# Patient Record
Sex: Female | Born: 1990
Health system: Southern US, Community
[De-identification: ages and names within clinical notes are randomized; demographics above are authoritative.]

## PROBLEM LIST (undated history)

## (undated) DIAGNOSIS — Z8619 Personal history of other infectious and parasitic diseases: Secondary | ICD-10-CM

## (undated) DIAGNOSIS — R3915 Urgency of urination: Secondary | ICD-10-CM

## (undated) DIAGNOSIS — N3010 Interstitial cystitis (chronic) without hematuria: Secondary | ICD-10-CM

## (undated) DIAGNOSIS — L309 Dermatitis, unspecified: Secondary | ICD-10-CM

## (undated) DIAGNOSIS — R350 Frequency of micturition: Secondary | ICD-10-CM

## (undated) DIAGNOSIS — H469 Unspecified optic neuritis: Secondary | ICD-10-CM

## (undated) DIAGNOSIS — Z973 Presence of spectacles and contact lenses: Secondary | ICD-10-CM

## (undated) DIAGNOSIS — R3989 Other symptoms and signs involving the genitourinary system: Secondary | ICD-10-CM

## (undated) DIAGNOSIS — R351 Nocturia: Secondary | ICD-10-CM

## (undated) HISTORY — DX: Interstitial cystitis (chronic) without hematuria: N30.10

## (undated) HISTORY — DX: Unspecified optic neuritis: H46.9

---

## 2011-02-13 ENCOUNTER — Emergency Department (HOSPITAL_COMMUNITY)
Admission: EM | Admit: 2011-02-13 | Discharge: 2011-02-13 | Disposition: A | Discharge status: Home / Self Care | Payer: Self-pay | Attending: Emergency Medicine | Admitting: Emergency Medicine

## 2011-02-13 DIAGNOSIS — N390 Urinary tract infection, site not specified: Secondary | ICD-10-CM | POA: Insufficient documentation

## 2011-02-13 DIAGNOSIS — R109 Unspecified abdominal pain: Secondary | ICD-10-CM | POA: Insufficient documentation

## 2011-02-13 LAB — URINE MICROSCOPIC-ADD ON

## 2011-02-13 LAB — URINALYSIS, ROUTINE W REFLEX MICROSCOPIC
Bilirubin Urine: NEGATIVE
Nitrite: NEGATIVE
Specific Gravity, Urine: 1.017 (ref 1.005–1.030)
pH: 7 (ref 5.0–8.0)

## 2011-02-13 LAB — POCT PREGNANCY, URINE: Preg Test, Ur: NEGATIVE

## 2011-12-02 ENCOUNTER — Encounter (HOSPITAL_COMMUNITY): Payer: Self-pay

## 2011-12-02 ENCOUNTER — Inpatient Hospital Stay (HOSPITAL_COMMUNITY)
Admission: AD | Admit: 2011-12-02 | Discharge: 2011-12-03 | Disposition: A | Discharge status: Home / Self Care | Payer: Self-pay | Source: Ambulatory Visit | Attending: Obstetrics & Gynecology | Admitting: Obstetrics & Gynecology

## 2011-12-02 DIAGNOSIS — R109 Unspecified abdominal pain: Secondary | ICD-10-CM | POA: Insufficient documentation

## 2011-12-02 DIAGNOSIS — R350 Frequency of micturition: Secondary | ICD-10-CM | POA: Insufficient documentation

## 2011-12-02 NOTE — MAU Note (Signed)
Patient is in with c/o constant severe sharp abdominal pain for over a month. She states that 2 weeks ago, she was treated for cervitis at the health dept. She denies any vaginal bleeding

## 2011-12-02 NOTE — MAU Note (Signed)
About 2 wks ago went to health dept and cervix was very irritated. Treated for cervicitis. Had intercourse last night and didn't cause pain but pain started afterward to the point I couldn't sleep. Frequent urination.

## 2011-12-03 ENCOUNTER — Encounter (HOSPITAL_COMMUNITY): Payer: Self-pay | Admitting: Family

## 2011-12-03 DIAGNOSIS — R350 Frequency of micturition: Secondary | ICD-10-CM

## 2011-12-03 LAB — WET PREP, GENITAL
Clue Cells Wet Prep HPF POC: NONE SEEN
Trich, Wet Prep: NONE SEEN

## 2011-12-03 LAB — URINALYSIS, ROUTINE W REFLEX MICROSCOPIC
Bilirubin Urine: NEGATIVE
Ketones, ur: NEGATIVE mg/dL
Nitrite: NEGATIVE
Protein, ur: NEGATIVE mg/dL
Specific Gravity, Urine: 1.01 (ref 1.005–1.030)
Urobilinogen, UA: 2 mg/dL — ABNORMAL HIGH (ref 0.0–1.0)

## 2011-12-03 LAB — URINE MICROSCOPIC-ADD ON

## 2011-12-03 MED ORDER — SEPTRA DS 800-160 MG PO TABS: 1.0000 | ORAL_TABLET | Freq: Two times a day (BID) | ORAL | Status: AC

## 2011-12-03 NOTE — MAU Provider Note (Signed)
History     CSN: [id number]  Arrival date and time: 12/02/11 2322   First Provider Initiated Contact with Patient 12/03/11 0023      Chief Complaint  Patient presents with  . Abdominal Pain   HPI  Pt is here with report of lower abdominal pain x 1 month.  No abnormal vaginal discharge or dysuria, +frequency x 1.5 wks.  No reports of fever, nausea, vomiting, or diarrhea.    Past Medical History  Diagnosis Date  . Chlamydia   . Gonorrhea   . UTI (lower urinary tract infection)     History reviewed. No pertinent past surgical history.  History reviewed. No pertinent family history.  History  Substance Use Topics  . Smoking status: Never Smoker   . Smokeless tobacco: Not on file  . Alcohol Use: No    Allergies: No Known Allergies  No prescriptions prior to admission    Review of Systems  Gastrointestinal: Positive for abdominal pain.  Genitourinary: Positive for frequency. Negative for dysuria, urgency and hematuria.  All other systems reviewed and are negative.   Physical Exam   Blood pressure 122/72, pulse 91, temperature 98.8 F (37.1 C), temperature source Oral, resp. rate 20, height 5\' 3"  (1.6 m), weight 50.077 kg (110 lb 6.4 oz), last menstrual period 11/11/2011.  Physical Exam  Constitutional: She is oriented to person, place, and time. She appears well-developed and well-nourished. No distress.  HENT:  Head: Normocephalic.  Eyes: Pupils are equal, round, and reactive to light.  Neck: Normal range of motion. Neck supple.  Cardiovascular: Normal rate, regular rhythm and normal heart sounds.   Respiratory: Effort normal and breath sounds normal.  GI: Soft. She exhibits no mass. There is no tenderness. There is no rebound and no guarding.  Genitourinary: Uterus normal. Uterus is not enlarged. Cervix exhibits no motion tenderness, no discharge and no friability. Right adnexum displays no mass, no tenderness and no fullness. Left adnexum displays no mass, no  tenderness and no fullness. Vaginal discharge (white, creamy) found.       Negative cervical motion tenderness  Neurological: She is alert and oriented to person, place, and time. She has normal reflexes.  Skin: Skin is warm and dry.    MAU Course  Procedures Results for orders placed during the hospital encounter of 12/02/11 (from the past 24 hour(s))  URINALYSIS, ROUTINE W REFLEX MICROSCOPIC     Status: Abnormal   Collection Time   12/02/11 11:35 PM      Component Value Range   Color, Urine YELLOW  YELLOW   APPearance CLEAR  CLEAR   Specific Gravity, Urine 1.010  1.005 - 1.030   pH 7.5  5.0 - 8.0   Glucose, UA NEGATIVE  NEGATIVE mg/dL   Hgb urine dipstick TRACE (*) NEGATIVE   Bilirubin Urine NEGATIVE  NEGATIVE   Ketones, ur NEGATIVE  NEGATIVE mg/dL   Protein, ur NEGATIVE  NEGATIVE mg/dL   Urobilinogen, UA 2.0 (*) 0.0 - 1.0 mg/dL   Nitrite NEGATIVE  NEGATIVE   Leukocytes, UA NEGATIVE  NEGATIVE  URINE MICROSCOPIC-ADD ON     Status: Normal   Collection Time   12/02/11 11:35 PM      Component Value Range   Squamous Epithelial / LPF RARE  RARE   WBC, UA 0-2  <3 WBC/hpf   RBC / HPF 0-2  <3 RBC/hpf  POCT PREGNANCY, URINE     Status: Normal   Collection Time   12/02/11 11:49 PM  Component Value Range   Preg Test, Ur NEGATIVE  NEGATIVE  WET PREP, GENITAL     Status: Abnormal   Collection Time   12/03/11 12:35 AM      Component Value Range   Yeast Wet Prep HPF POC NONE SEEN  NONE SEEN   Trich, Wet Prep NONE SEEN  NONE SEEN   Clue Cells Wet Prep HPF POC NONE SEEN  NONE SEEN   WBC, Wet Prep HPF POC FEW (*) NONE SEEN     Assessment and Plan  Urinary Frequency  Plan: RX Bactrim Urine Culture Follow-up prn   [facility] 12/03/2011, 12:24 AM

## 2011-12-03 NOTE — MAU Provider Note (Signed)
Attestation of Attending Supervision of Advanced Practitioner (CNM/NP): Evaluation and management procedures were performed by the Advanced Practitioner under my supervision and collaboration.  I have reviewed the Advanced Practitioner's note and chart, and I agree with the management and plan.  Jaynie Collins, M.D. 12/03/2011 8:29 AM

## 2011-12-05 LAB — GC/CHLAMYDIA PROBE AMP, GENITAL
Chlamydia, DNA Probe: NEGATIVE
GC Probe Amp, Genital: NEGATIVE

## 2013-07-25 HISTORY — PX: LIPOSUCTION: SHX10

## 2013-08-13 ENCOUNTER — Encounter (HOSPITAL_COMMUNITY): Payer: Self-pay | Admitting: Emergency Medicine

## 2013-08-13 ENCOUNTER — Emergency Department (HOSPITAL_COMMUNITY): Payer: BC Managed Care – PPO

## 2013-08-13 ENCOUNTER — Emergency Department (HOSPITAL_COMMUNITY)
Admission: EM | Admit: 2013-08-13 | Discharge: 2013-08-13 | Disposition: A | Discharge status: Home / Self Care | Payer: BC Managed Care – PPO | Attending: Emergency Medicine | Admitting: Emergency Medicine

## 2013-08-13 ENCOUNTER — Emergency Department (HOSPITAL_COMMUNITY)
Admission: EM | Admit: 2013-08-13 | Discharge: 2013-08-13 | Discharge status: Against Medical Advice | Payer: BC Managed Care – PPO | Attending: Emergency Medicine | Admitting: Emergency Medicine

## 2013-08-13 DIAGNOSIS — R0602 Shortness of breath: Secondary | ICD-10-CM | POA: Insufficient documentation

## 2013-08-13 DIAGNOSIS — Z87440 Personal history of urinary (tract) infections: Secondary | ICD-10-CM | POA: Insufficient documentation

## 2013-08-13 DIAGNOSIS — Z3202 Encounter for pregnancy test, result negative: Secondary | ICD-10-CM | POA: Insufficient documentation

## 2013-08-13 DIAGNOSIS — Z8619 Personal history of other infectious and parasitic diseases: Secondary | ICD-10-CM | POA: Insufficient documentation

## 2013-08-13 DIAGNOSIS — R519 Headache, unspecified: Secondary | ICD-10-CM

## 2013-08-13 DIAGNOSIS — H53149 Visual discomfort, unspecified: Secondary | ICD-10-CM | POA: Insufficient documentation

## 2013-08-13 DIAGNOSIS — R5383 Other fatigue: Secondary | ICD-10-CM

## 2013-08-13 DIAGNOSIS — R110 Nausea: Secondary | ICD-10-CM | POA: Insufficient documentation

## 2013-08-13 DIAGNOSIS — R51 Headache: Secondary | ICD-10-CM | POA: Insufficient documentation

## 2013-08-13 DIAGNOSIS — R5381 Other malaise: Secondary | ICD-10-CM | POA: Insufficient documentation

## 2013-08-13 DIAGNOSIS — G43909 Migraine, unspecified, not intractable, without status migrainosus: Secondary | ICD-10-CM | POA: Insufficient documentation

## 2013-08-13 LAB — I-STAT TROPONIN, ED: Troponin i, poc: 0 ng/mL (ref 0.00–0.08)

## 2013-08-13 LAB — BASIC METABOLIC PANEL
BUN: 7 mg/dL (ref 6–23)
CALCIUM: 9.5 mg/dL (ref 8.4–10.5)
CO2: 21 meq/L (ref 19–32)
CREATININE: 0.49 mg/dL — AB (ref 0.50–1.10)
Chloride: 103 mEq/L (ref 96–112)
GFR calc Af Amer: >90 mL/min (ref >90)
Glucose, Bld: 98 mg/dL (ref 70–99)
Potassium: 3.5 mEq/L — ABNORMAL LOW (ref 3.7–5.3)
Sodium: 142 mEq/L (ref 137–147)

## 2013-08-13 LAB — CBC WITH DIFFERENTIAL/PLATELET
BASOS ABS: 0.1 10*3/uL (ref 0.0–0.1)
BASOS PCT: 1 % (ref 0–1)
EOS ABS: 0 10*3/uL (ref 0.0–0.7)
EOS PCT: 0 % (ref 0–5)
HEMATOCRIT: 37.4 % (ref 36.0–46.0)
Hemoglobin: 13.8 g/dL (ref 12.0–15.0)
LYMPHS PCT: 18 % (ref 12–46)
Lymphs Abs: 0.9 10*3/uL (ref 0.7–4.0)
MCH: 32.4 pg (ref 26.0–34.0)
MCHC: 36.9 g/dL — AB (ref 30.0–36.0)
MCV: 87.8 fL (ref 78.0–100.0)
MONO ABS: 0.2 10*3/uL (ref 0.1–1.0)
Monocytes Relative: 3 % (ref 3–12)
Neutro Abs: 3.7 10*3/uL (ref 1.7–7.7)
Neutrophils Relative %: 77 % (ref 43–77)
PLATELETS: 243 10*3/uL (ref 150–400)
RBC: 4.26 MIL/uL (ref 3.87–5.11)
RDW: 12.7 % (ref 11.5–15.5)
WBC: 4.8 10*3/uL (ref 4.0–10.5)

## 2013-08-13 LAB — POC URINE PREG, ED: Preg Test, Ur: NEGATIVE

## 2013-08-13 LAB — D-DIMER, QUANTITATIVE (NOT AT ARMC)

## 2013-08-13 IMAGING — CR DG CHEST 2V
2 series · 2 of 2 positions shown · non-contrast
Comparison: None.

CLINICAL DATA: Shortness of breath.

EXAM:
CHEST  2 VIEW

[w chest pa]
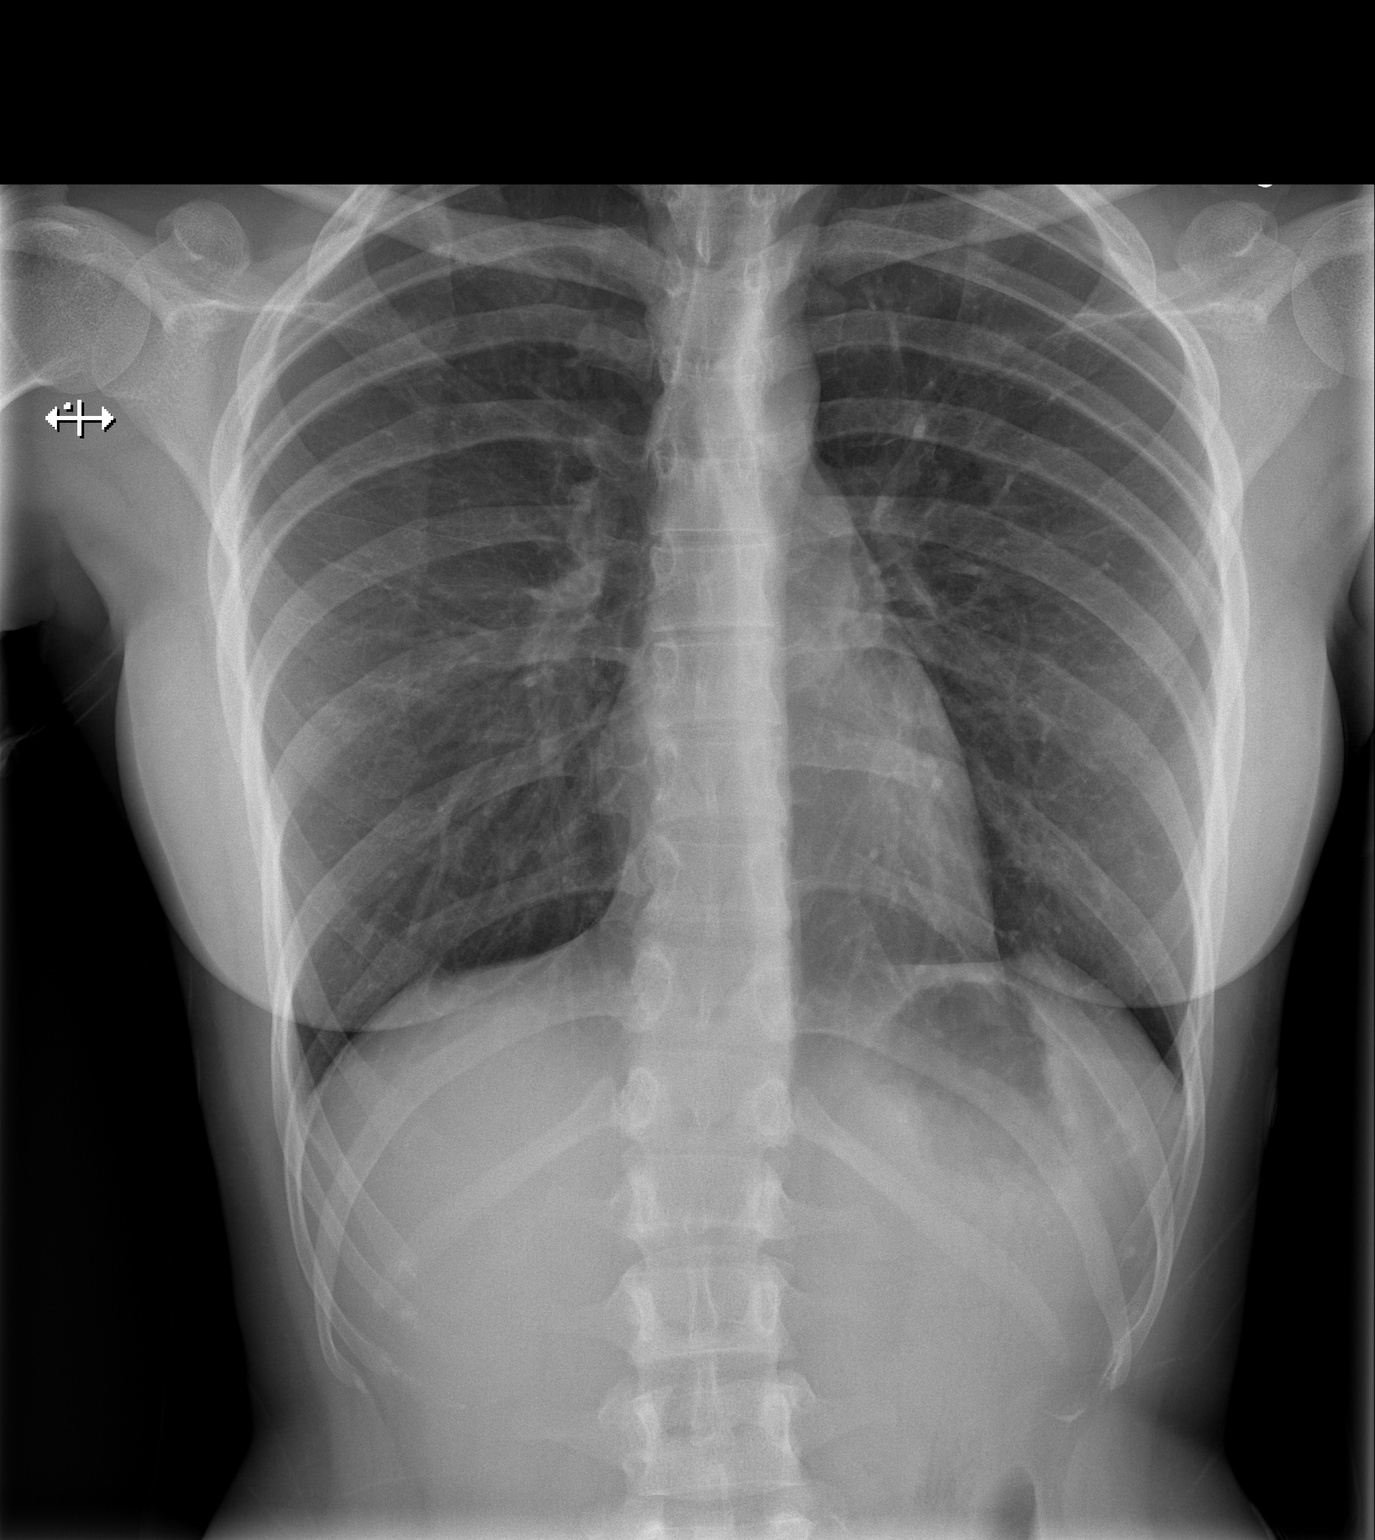

[w chest lat]
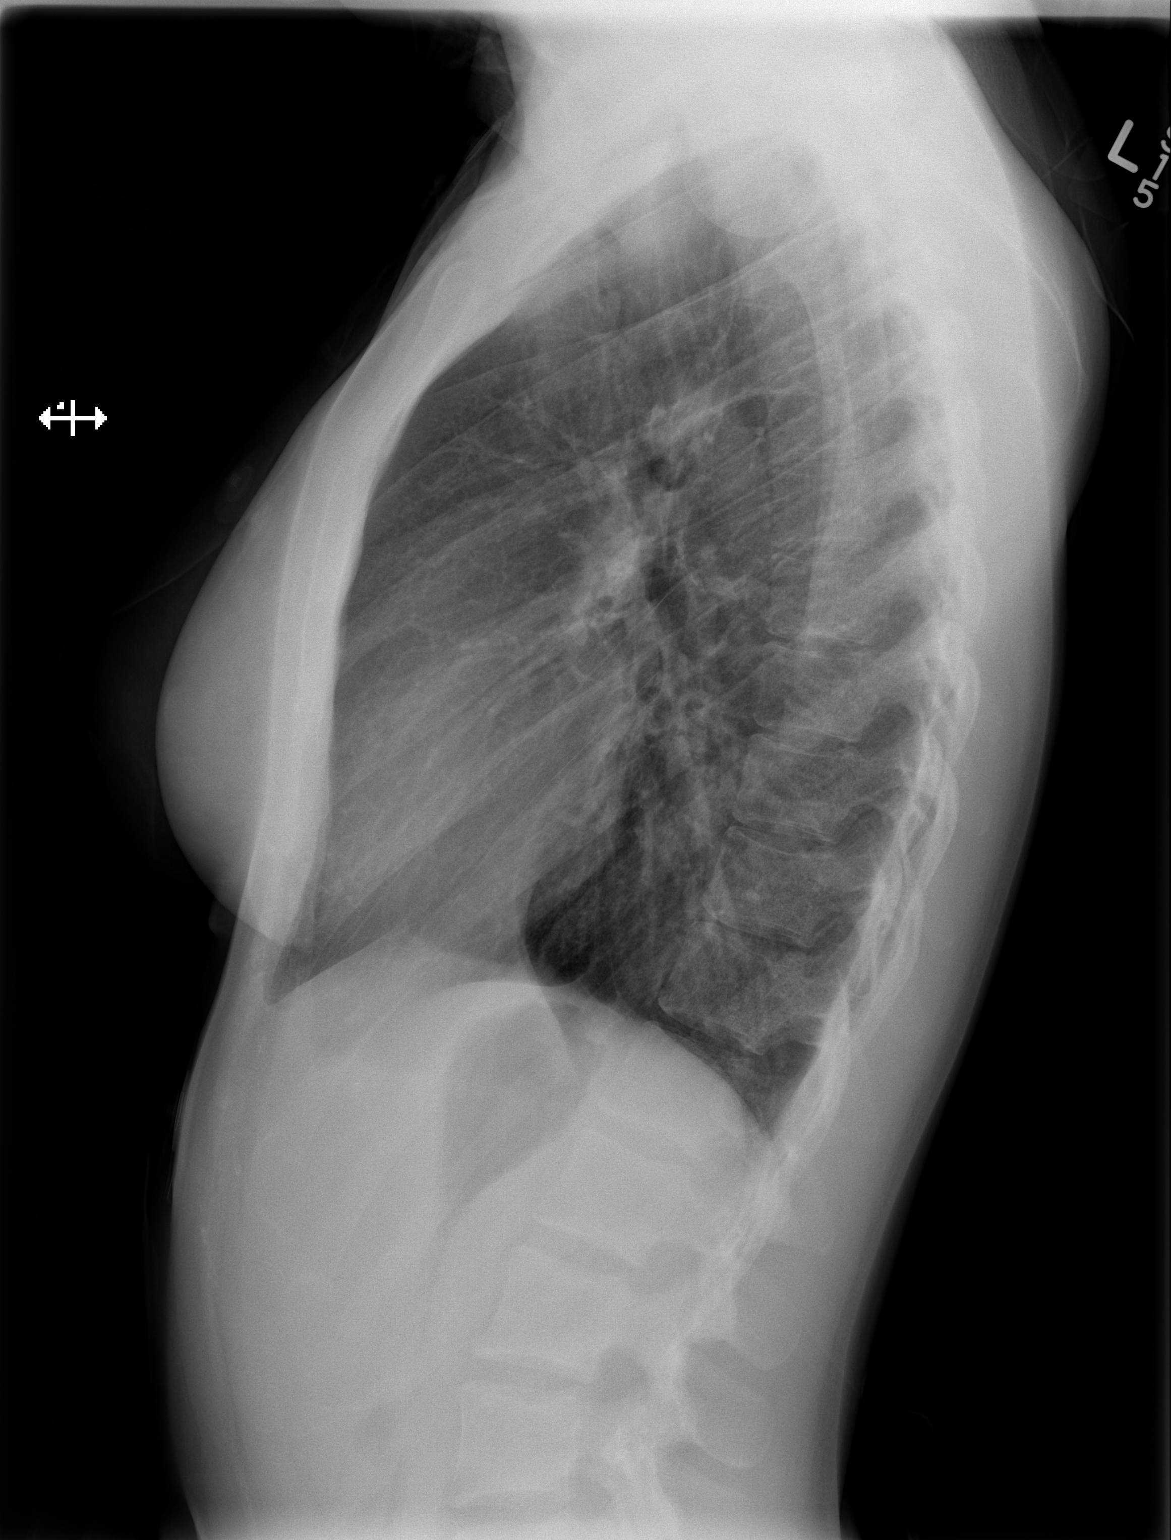

[2 of 2 positions shown; findings below may reference images not displayed]

FINDINGS: Questionable biapical nodular densities, most likely overlapping
shadows. Apical lordotic chest x-ray suggested for further
evaluation. Lungs are otherwise clear. No pleural effusion or
pneumothorax. Mediastinum and hilar structures are normal. Heart
size and pulmonary vascularity normal. No pleural effusion or
pneumothorax. No acute bony abnormality .
IMPRESSION: Questionable biapical nodular opacities, most likely overlapping
structures. Apical lordotic chest x-ray suggested for further
evaluation.

## 2013-08-13 MED ORDER — DIPHENHYDRAMINE HCL 25 MG PO CAPS
50.0000 mg | ORAL_CAPSULE | Freq: Once | ORAL | Status: AC
  Administered 2013-08-13: 50 mg via ORAL
  Filled 2013-08-13: qty 2

## 2013-08-13 MED ORDER — PROCHLORPERAZINE EDISYLATE 5 MG/ML IJ SOLN
10.0000 mg | Freq: Once | INTRAMUSCULAR | Status: AC
  Administered 2013-08-13: 10 mg via INTRAVENOUS
  Filled 2013-08-13: qty 2

## 2013-08-13 MED ORDER — DIPHENHYDRAMINE HCL 50 MG/ML IJ SOLN
12.5000 mg | Freq: Once | INTRAMUSCULAR | Status: DC
  Filled 2013-08-13: qty 1

## 2013-08-13 MED ORDER — METOCLOPRAMIDE HCL 5 MG/ML IJ SOLN
10.0000 mg | Freq: Once | INTRAMUSCULAR | Status: DC
  Filled 2013-08-13: qty 2

## 2013-08-13 MED ORDER — KETOROLAC TROMETHAMINE 15 MG/ML IJ SOLN
15.0000 mg | Freq: Once | INTRAMUSCULAR | Status: DC
  Filled 2013-08-13: qty 1

## 2013-08-13 NOTE — ED Notes (Signed)
Pt reports recent liposuction on 2/26.  COnsulted with Tatyana PA and orders placed.  Question reaction to compazine versus PE.

## 2013-08-13 NOTE — ED Notes (Signed)
Patient has hx of migraines, has had this present migraine for 2 days which is on the left side, states that it feels different only because it is persistent and wont subside despite taking oxycodone and 800 mg ibuprofen, and advil for migraines and it feels slightly worse.  States she feels slightly nauseated which is usual denies recent injury

## 2013-08-13 NOTE — ED Notes (Signed)
Pt was seen here earlier and treated for migraines, pt states they gave you IV medications, made headache go away.  Pt states she was starting to fell weird sensation in left arm and felt sob, like something with her heart.

## 2013-08-13 NOTE — ED Provider Notes (Signed)
I saw and evaluated the patient, reviewed the resident's note and I agree with the findings and plan.   EKG Interpretation None      Typical migraine headache for the pt. Non focal neuro exam. No recent head trauma. No fever. Doubt meningitis. Doubt intracranial bleed. Doubt normal pressure hydrocephalus. No indication for imaging. Improvement after compazine  Lyanne CoKevin M Lauris Keepers, MD 08/13/13 1053

## 2013-08-13 NOTE — ED Provider Notes (Signed)
CSN: [id number]     Arrival date & time 08/13/13  [id number] History   None    Chief Complaint  Patient presents with  . Migraine     (Consider location/radiation/quality/duration/timing/severity/associated sxs/prior Treatment) Patient is a 23 y.o. female presenting with migraines. The history is provided by the patient. No language interpreter was used.  Migraine Associated symptoms include fatigue, headaches and nausea. Pertinent negatives include no abdominal pain, chest pain, chills, congestion, coughing, fever, numbness, sore throat, vomiting or weakness.  This is a 22yo AAF w/ no significant PMH who presents with c/o typical L sided migraine HA. Patient reports having onset of throbbing L sided HA 2 days ago, associated with some nausea, photophobia, and phonophobia. No vomiting, focal weakness or numbness/tingling, vision changes. Patient has h/o similar HAs though she thinks the current HA is worse given it has lasted longer than usual and is not responding to OTC medications. She has taken ibuprofen, advil migraine, and oxycodone without relief.   Past Medical History  Diagnosis Date  . Chlamydia   . Gonorrhea   . UTI (lower urinary tract infection)    Past Surgical History  Procedure Laterality Date  . Liposuction  07/25/13   No family history on file. History  Substance Use Topics  . Smoking status: Never Smoker   . Smokeless tobacco: Not on file  . Alcohol Use: Yes   OB History   Grav Para Term Preterm Abortions TAB SAB Ect Mult Living                 Review of Systems  Constitutional: Positive for fatigue. Negative for fever and chills.  HENT: Negative for congestion and sore throat.   Eyes: Negative for visual disturbance.  Respiratory: Negative for cough and shortness of breath.   Cardiovascular: Negative for chest pain.  Gastrointestinal: Positive for nausea. Negative for vomiting, abdominal pain and diarrhea.  Genitourinary: Negative for dysuria.  Neurological:  Positive for headaches. Negative for weakness and numbness.  All other systems reviewed and are negative.      Allergies  Review of patient's allergies indicates no known allergies.  Home Medications  No current outpatient prescriptions on file. BP 119/86  Pulse 93  Temp(Src) 98.2 F (36.8 C) (Oral)  Resp 18  Ht 5\' 4"  (1.626 m)  Wt 113 lb (51.256 kg)  BMI 19.39 kg/m2  LMP 08/08/2013 Physical Exam  Nursing note and vitals reviewed. Constitutional: She is oriented to person, place, and time. She appears well-developed and well-nourished. No distress.  HENT:  Head: Normocephalic and atraumatic.  Mouth/Throat: Oropharynx is clear and moist.  Eyes: Conjunctivae and EOM are normal. Pupils are equal, round, and reactive to light.  Neck: Normal range of motion. Neck supple.  Cardiovascular: Normal rate, regular rhythm and normal heart sounds.   Pulmonary/Chest: Effort normal and breath sounds normal.  Abdominal: Soft. Bowel sounds are normal. There is no tenderness.  Musculoskeletal: She exhibits no edema.  Neurological: She is alert and oriented to person, place, and time. No cranial nerve deficit. Coordination normal.  Strength is 5/5 throughout, sensation intact to light touch throughout  Skin: Skin is warm and dry. She is not diaphoretic.    ED Course  Procedures (including critical care time) Labs Review Labs Reviewed - No data to display Imaging Review No results found.   EKG Interpretation None      MDM   This is a 22yo AAF with no PMH who presents with typical migraine HA. No concern for  more serious intracranial mass or other process as she has a nonfocal neurological exam and her symptoms are typical for her migraines. Normal ROM of neck, no neck pain and no F/C, so I do not suspect meningitis. Will attempt to break the migraine with compazine only to start. Pt does not have someone to drive her home, so we will use caution when using sedating medications.    10:48 AM Patient reevaluated by Dr. Patria Mane. Patient's HA has resolved. She is ready for discharge.  Windell Hummingbird, MD 08/13/13 1048

## 2013-08-13 NOTE — Discharge Instructions (Signed)
Migraine Headache A migraine headache is an intense, throbbing pain on one or both sides of your head. A migraine can last for 30 minutes to several hours. CAUSES  The exact cause of a migraine headache is not always known. However, a migraine may be caused when nerves in the brain become irritated and release chemicals that cause inflammation. This causes pain. Certain things may also trigger migraines, such as:  Alcohol.  Smoking.  Stress.  Menstruation.  Aged cheeses.  Foods or drinks that contain nitrates, glutamate, aspartame, or tyramine.  Lack of sleep.  Chocolate.  Caffeine.  Hunger.  Physical exertion.  Fatigue.  Medicines used to treat chest pain (nitroglycerine), birth control pills, estrogen, and some blood pressure medicines. SIGNS AND SYMPTOMS  Pain on one or both sides of your head.  Pulsating or throbbing pain.  Severe pain that prevents daily activities.  Pain that is aggravated by any physical activity.  Nausea, vomiting, or both.  Dizziness.  Pain with exposure to bright lights, loud noises, or activity.  General sensitivity to bright lights, loud noises, or smells. Before you get a migraine, you may get warning signs that a migraine is coming (aura). An aura may include:  Seeing flashing lights.  Seeing bright spots, halos, or zig-zag lines.  Having tunnel vision or blurred vision.  Having feelings of numbness or tingling.  Having trouble talking.  Having muscle weakness. DIAGNOSIS  A migraine headache is often diagnosed based on:  Symptoms.  Physical exam.  A CT scan or MRI of your head. These imaging tests cannot diagnose migraines, but they can help rule out other causes of headaches. TREATMENT Medicines may be given for pain and nausea. Medicines can also be given to help prevent recurrent migraines.  HOME CARE INSTRUCTIONS  Only take over-the-counter or prescription medicines for pain or discomfort as directed by your  health care provider. The use of long-term narcotics is not recommended.  Lie down in a dark, quiet room when you have a migraine.  Keep a journal to find out what may trigger your migraine headaches. For example, write down:  What you eat and drink.  How much sleep you get.  Any change to your diet or medicines.  Limit alcohol consumption.  Quit smoking if you smoke.  Get 7 9 hours of sleep, or as recommended by your health care provider.  Limit stress.  Keep lights dim if bright lights bother you and make your migraines worse. SEEK IMMEDIATE MEDICAL CARE IF:   Your migraine becomes severe.  You have a fever.  You have a stiff neck.  You have vision loss.  You have muscular weakness or loss of muscle control.  You start losing your balance or have trouble walking.  You feel faint or pass out.  You have severe symptoms that are different from your first symptoms. MAKE SURE YOU:   Understand these instructions.  Will watch your condition.  Will get help right away if you are not doing well or get worse. Document Released: 05/16/2005 Document Revised: 03/06/2013 Document Reviewed: 01/21/2013 Exit[employer] Patient Information 2014 [employer].  

## 2013-08-13 NOTE — Discharge Planning (Signed)
[facility] Felicia E, [employer] Liaison  Spoke to patient about primary care resources and establishing care with a provider. Patient does receive insurance through her college. Resource guide and my contact information was given for any future questions or concerns.

## 2013-08-13 NOTE — ED Notes (Signed)
Nurse First Rounds : Unable to locate pt. at triage and waiting area several times.

## 2014-07-16 ENCOUNTER — Encounter (HOSPITAL_COMMUNITY): Payer: Self-pay | Admitting: Emergency Medicine

## 2014-07-16 ENCOUNTER — Emergency Department (HOSPITAL_COMMUNITY)
Admission: EM | Admit: 2014-07-16 | Discharge: 2014-07-16 | Disposition: A | Discharge status: Home / Self Care | Payer: BLUE CROSS/BLUE SHIELD | Source: Home / Self Care | Attending: Family Medicine | Admitting: Family Medicine

## 2014-07-16 DIAGNOSIS — K120 Recurrent oral aphthae: Secondary | ICD-10-CM

## 2014-07-16 MED ORDER — TRIAMCINOLONE ACETONIDE 0.1 % MT PSTE: 1.0000 "application " | PASTE | Freq: Two times a day (BID) | OROMUCOSAL | Status: DC

## 2014-07-16 NOTE — ED Notes (Signed)
C/o mouth sore inside lower lip onset Sunday Denies fevers, chills Taking Septra for UTI Alert, no signs of acute distress

## 2014-07-16 NOTE — ED Notes (Signed)
Bed: UC06 Expected date:  Expected time:  Means of arrival:  Comments: Beached at [id number]

## 2014-07-16 NOTE — Discharge Instructions (Signed)
Thank you for coming in today. Call or go to the emergency room if you get worse, have trouble breathing, have chest pains, or palpitations.    Oral Ulcers Oral ulcers are painful, shallow sores around the lining of the mouth. They can affect the gums, the inside of the lips, and the cheeks. (Sores on the outside of the lips and on the face are different.) They typically first occur in school-aged children and teenagers. Oral ulcers may also be called canker sores or cold sores. CAUSES  Canker sores and cold sores can be caused by many factors including:  Infection.  Injury.  Sun exposure.  Medications.  Emotional stress.  Food allergies.  Vitamin deficiencies.  Toothpastes containing sodium lauryl sulfate. The herpes virus can be the cause of mouth ulcers. The first infection can be severe and cause 10 or more ulcers on the gums, tongue, and lips with fever and difficulty in swallowing. This infection usually occurs between the ages of 1 and 3 years.  SYMPTOMS  The typical sore is about  inch (6 mm) in size and is an oval or round ulcer with red borders. DIAGNOSIS  Your caregiver can diagnose simple oral ulcers by examination. Additional testing is usually not required.  TREATMENT  Treatment is aimed at pain relief. Generally, oral ulcers resolve by themselves within 1 to 2 weeks without medication and are not contagious unless caused by herpes (and other viruses). Antibiotics are not effective with mouth sores. Avoid direct contact with others until the ulcer is completely healed. See your caregiver for follow-up care as recommended. Also:  Offer a soft diet.  Encourage plenty of fluids to prevent dehydration. Popsicles and milk shakes can be helpful.  Avoid acidic and salty foods and drinks such as orange juice.  Infants and young children will often refuse to drink because of pain. Using a teaspoon, cup, or syringe to give small amounts of fluids frequently can help prevent  dehydration.  Cold compresses on the face may help reduce pain.  Pain medication can help control soreness.  A solution of diphenhydramine mixed with a liquid antacid can be useful to decrease the soreness of ulcers. Consult a caregiver for the dosing.  Liquids or ointments with a numbing ingredient may be helpful when used as recommended.  Older children and teenagers can rinse their mouth with a salt-water mixture (1/2 teaspoon of salt in 8 ounces of water) four times a day. This treatment is uncomfortable but may reduce the time the ulcers are present.  There are many over-the-counter throat lozenges and medications available for oral ulcers. Their effectiveness has not been studied.  Consult your medical caregiver prior to using homeopathic treatments for oral ulcers. SEEK MEDICAL CARE IF:   You think your child needs to be seen.  The pain worsens and you cannot control it.  There are 4 or more ulcers.  The lips and gums begin to bleed and crust.  A single mouth ulcer is near a tooth that is causing a toothache or pain.  Your child has a fever, swollen face, or swollen glands.  The ulcers began after starting a medication.  Mouth ulcers keep reoccurring or last more than 2 weeks.  You think your child is not taking adequate fluids. SEEK IMMEDIATE MEDICAL CARE IF:   Your child has a high fever.  Your child is unable to swallow or becomes dehydrated.  Your child looks or acts very ill.  An ulcer caused by a chemical your child  accidentally put in their mouth. Document Released: 06/23/2004 Document Revised: 09/30/2013 Document Reviewed: 02/05/2009 [facility] Patient Information 2015 [employer], [place]. This information is not intended to replace advice given to you by your health care provider. Make sure you discuss any questions you have with your health care provider.

## 2014-07-16 NOTE — ED Provider Notes (Signed)
Terri Taylor is a 24 y.o. female who presents to Urgent Care today for oral ulceration. Patient has a oral ulcer on her left lower oral mucosa present for 3-4 days. Patient has recently been exposed to Bactrim antibiotics but stopped taking it as it ran out. She denies any fevers or chills vomiting or diarrhea. No tongue or lip swelling. No treatment tried yet.   Past Medical History  Diagnosis Date  . Chlamydia   . Gonorrhea   . UTI (lower urinary tract infection)    Past Surgical History  Procedure Laterality Date  . Liposuction  07/25/13   History  Substance Use Topics  . Smoking status: Never Smoker   . Smokeless tobacco: Not on file  . Alcohol Use: Yes   ROS as above Medications: No current facility-administered medications for this encounter.   Current Outpatient Prescriptions  Medication Sig Dispense Refill  . Ibuprofen (ADVIL MIGRAINE PO) Take 2 tablets by mouth daily as needed (migraine).    Marland Kitchen. ibuprofen (ADVIL,MOTRIN) 800 MG tablet Take 800 mg by mouth every 8 (eight) hours as needed (migraine).    Lorita Officer. Norgestim-Eth Estrad Triphasic (TRI-SPRINTEC PO) Take 1 tablet by mouth daily.    Marland Kitchen. OVER THE COUNTER MEDICATION Take 1 tablet by mouth 2 (two) times daily. Mane Tabolism    . oxyCODONE-acetaminophen (PERCOCET/ROXICET) 5-325 MG per tablet Take 1 tablet by mouth every 8 (eight) hours as needed for severe pain.    Marland Kitchen. triamcinolone (KENALOG) 0.1 % paste Use as directed 1 application in the mouth or throat 2 (two) times daily. 5 g 2   No Known Allergies   Exam:  BP 116/79 mmHg  Pulse 89  Temp(Src) 98.2 F (36.8 C) (Oral)  Resp 16  SpO2 100%  LMP 07/16/2014 Gen: Well NAD HEENT: EOMI,  MMM no tongue or lip swelling. 1 cm oral ulceration at the left lower lip mucosa. Tender to touch. Consistent with large canker sore. Lungs: Normal work of breathing. CTABL Heart: RRR no MRG Abd: NABS, Soft. Nondistended, Nontender Exts: Brisk capillary refill, warm and well perfused.    No results found for this or any previous visit (from the past 24 hour(s)). No results found.  Assessment and Plan: 24 y.o. female with canker sore. Treat with triamcinolone compounded with oral paste. Return as needed.  Discussed warning signs or symptoms. Please see discharge instructions. Patient expresses understanding.     Rodolph BongEvan S Eugene Zeiders, MD 07/16/14 [id number]

## 2014-09-30 ENCOUNTER — Other Ambulatory Visit: Payer: Self-pay | Admitting: Urology

## 2014-10-16 ENCOUNTER — Encounter (HOSPITAL_BASED_OUTPATIENT_CLINIC_OR_DEPARTMENT_OTHER): Payer: Self-pay | Admitting: *Deleted

## 2014-10-20 ENCOUNTER — Encounter (HOSPITAL_BASED_OUTPATIENT_CLINIC_OR_DEPARTMENT_OTHER): Payer: Self-pay | Admitting: *Deleted

## 2014-10-20 NOTE — Progress Notes (Signed)
NPO AFTER MN.  ARRIVE AT 0830.  NEEDS HG AND URINE PREG.  

## 2014-10-20 NOTE — H&P (Signed)
Active Problems Problems  1. Microscopic hematuria (R31.2) 2. Nocturia (R35.1) 3. Urinary urgency (R39.15)  History of Present Illness    Terri Taylor returns today in f/u for her history of UTI' and voiding symptoms.  He had a negative BMP and CT scan. He continues to have frequency and nocturia x 4. She is having some pain and stinging in her hands and feet. She has discomfort in the bladder post voiding. She has no hesitancy and she has a good stream. She doesn't feel like she empties well. She has no dysparunia. She has no other GU history. She does drink caffeine and that can increase her bladder irritation.   Past Medical History Problems  1. History of Incomplete bladder emptying (R33.9) 2. History of No acute medical problems  Surgical History Problems  1. History of Skin Graft Harvest For Autograft, 100cm2 Or Less  Current Meds 1. No Reported Medications Recorded  Allergies Medication  1. No Known Drug Allergies  Family History Problems  1. No pertinent family history : Mother  Social History Problems  1. Alcohol use (Z78.9)   ocassionally 2. Caffeine use (F15.90)   3 cups per day 3. Never a smoker 4. Number of children   0 5. Occupation   CNA 6. Single  Review of Systems Genitourinary, constitutional, skin, eye, otolaryngeal, hematologic/lymphatic, cardiovascular, pulmonary, endocrine, musculoskeletal, gastrointestinal, neurological and psychiatric system(s) were reviewed and pertinent findings if present are noted and are otherwise negative.  Genitourinary: urinary frequency and nocturia.  Musculoskeletal: back pain.    Vitals Vital Signs [Data Includes: Last 1 Day]  Recorded: 27Apr2016 01:03PM  Blood Pressure: 113 / 77 Temperature: 98.8 F Heart Rate: 94  Physical Exam Constitutional: Well nourished and well developed . No acute distress.  Pulmonary: No respiratory distress and normal respiratory rhythm and effort.    Results/Data Urine [Data  Includes: Last 1 Day]   27Apr2016  COLOR YELLOW   APPEARANCE CLEAR   SPECIFIC GRAVITY 1.025   pH 7.0   GLUCOSE NEG mg/dL  BILIRUBIN NEG   KETONE TRACE mg/dL  BLOOD MOD   PROTEIN 100 mg/dL  UROBILINOGEN 0.2 mg/dL  NITRITE NEG   LEUKOCYTE ESTERASE TRACE   SQUAMOUS EPITHELIAL/HPF MANY   WBC 3-6 WBC/hpf  RBC 3-6 RBC/hpf  BACTERIA MODERATE   CRYSTALS NONE SEEN   CASTS NONE SEEN   Other MUCUS NOTED    The following images/tracing/specimen were independently visualized:  CT films and report reviewed.  The following clinical lab reports were reviewed:  UA and BMP reviewed. Urine looks contaminated today. Selected Results  AU CT-HEMATURIA PROTOCOL 08Apr2016 12:00AM [name]   Test Name Result Flag Reference  AU CT-HEMATURIA PROTOCOL (Report)    ** RADIOLOGY REPORT BY [facility] **   CLINICAL DATA: Initial encounter for microhematuria.  EXAM: CT ABDOMEN AND PELVIS WITHOUT AND WITH CONTRAST  TECHNIQUE: Multidetector CT imaging of the abdomen and pelvis was performed following the standard protocol before and following the bolus administration of intravenous contrast.  CONTRAST: 150 cc Isovue 300.  COMPARISON: None.  FINDINGS: Lower chest: Unremarkable.  Hepatobiliary: No focal abnormality within the liver parenchyma. There is no evidence for gallstones, gallbladder wall thickening, or pericholecystic fluid. No intrahepatic or extrahepatic biliary dilation.  Pancreas: No focal mass lesion. No dilatation of the main duct. No intraparenchymal cyst. No peripancreatic edema.  Spleen: No splenomegaly. No focal mass lesion.  Adrenals/Urinary Tract: No adrenal nodule or mass. 12 mm cyst is identified in the interpolar left kidney. No  enhancing lesion in either kidney. The opacified intrarenal collecting systems are normal bilaterally. No evidence for abnormality in either renal pelvis. Both ureters are well opacified and show no focal hydroureter, wall  thickening, or intraluminal filling defect. No focal bladder wall abnormality is evident with limited assessment of the posterior bladder wall secondary to adjacent on opacified urine.  Stomach/Bowel: Stomach is nondistended. No gastric wall thickening. No evidence of outlet obstruction. Duodenum is normally positioned as is the ligament of Treitz. No small bowel wall thickening. No small bowel dilatation. Insert normal appendix No gross colonic mass. No colonic wall thickening. No substantial diverticular change.  Vascular/Lymphatic: No abdominal aortic aneurysm. No evidence for lymphadenopathy in the abdomen or pelvis.  Reproductive: Uterus is normal in appearance. No adnexal mass.  Other: No intraperitoneal free fluid.  Musculoskeletal: Bone windows reveal no worrisome lytic or sclerotic osseous lesions.  IMPRESSION: No CT findings to explain the patient's history of micro hematuria. 12 mm cyst in the interpolar left kidney shows no enhancement after IV contrast administration.   Electronically Signed  By: Misty Stanley M.D.  On: 09/05/2014 09:38   BASIC METABOLIC PANEL [id number] 96:[id number] [name]  SPECIMEN TYPE: BLOOD   Test Name Result Flag Reference  GLUCOSE 82 mg/dL  70-99  BUN 13 mg/dL  6-23  CREATININE 0.56 mg/dL  0.50-1.40  SODIUM 138 mEq/L  135-145  POTASSIUM 4.0 mEq/L  3.5-5.3  CHLORIDE 103 mEq/L  96-112  CO2 24 mEq/L  19-32  CALCIUM 9.9 mg/dL  8.4-10.5  Est GFR, African American >89 mL/min    Est GFR, NonAfrican American >89 mL/min    THE ESTIMATED GFR IS A CALCULATION VALID FOR ADULTS (>=34 YEARS OLD) THAT USES THE CKD-EPI ALGORITHM TO ADJUST FOR AGE AND SEX. IT IS   NOT TO BE USED FOR CHILDREN, PREGNANT WOMEN, HOSPITALIZED PATIENTS,    PATIENTS ON DIALYSIS, OR WITH RAPIDLY CHANGING KIDNEY FUNCTION. ACCORDING TO THE NKDEP, EGFR >89 IS NORMAL, 60-89 SHOWS MILD IMPAIRMENT, 30-59 SHOWS MODERATE IMPAIRMENT, 15-29 SHOWS SEVERE IMPAIRMENT AND <15 IS ESRD.    Assessment Assessed  1. Microscopic hematuria (R31.2) 2. Nocturia (R35.1) 3. Urinary urgency (R39.15)  The CT just showed a 34m simple left renal cyst and possibly a left ovarian cyst.   She has a few WBC and RBC in the urine today but it looks contaminated.  Her symptoms have not improved.   Plan Health Maintenance  1. UA With REFLEX; [Do Not Release]; Status:Resulted - Requires Verification;   Done:  27Apr2016 12:56PM Nocturia  2. Follow-up Schedule Surgery Office  Follow-up  Status: Hold For - Appointment   Requested for: 27Apr2016 3. URINE CULTURE; Status:Hold For - Specimen/Data Collection,Appointment; Requested  for:27Apr2016;   Urine culture.  I am going to set her up for outpatient cystoscopy with HOD and instillation of pyridium and marcaine. Risks of bleeding, infection, bladder injury, retention, thrombotic events and anesthetic complications reviewed.

## 2014-10-21 ENCOUNTER — Encounter (HOSPITAL_BASED_OUTPATIENT_CLINIC_OR_DEPARTMENT_OTHER)
Admission: RE | Disposition: A | Discharge status: Home / Self Care | Payer: Self-pay | Source: Ambulatory Visit | Attending: Urology

## 2014-10-21 ENCOUNTER — Ambulatory Visit (HOSPITAL_BASED_OUTPATIENT_CLINIC_OR_DEPARTMENT_OTHER): Payer: BLUE CROSS/BLUE SHIELD | Admitting: Anesthesiology

## 2014-10-21 ENCOUNTER — Encounter (HOSPITAL_BASED_OUTPATIENT_CLINIC_OR_DEPARTMENT_OTHER): Payer: Self-pay | Admitting: Anesthesiology

## 2014-10-21 ENCOUNTER — Ambulatory Visit (HOSPITAL_BASED_OUTPATIENT_CLINIC_OR_DEPARTMENT_OTHER)
Admission: RE | Admit: 2014-10-21 | Discharge: 2014-10-21 | Disposition: A | Discharge status: Home / Self Care | Payer: BLUE CROSS/BLUE SHIELD | Source: Ambulatory Visit | Attending: Urology | Admitting: Urology

## 2014-10-21 DIAGNOSIS — N3010 Interstitial cystitis (chronic) without hematuria: Secondary | ICD-10-CM | POA: Insufficient documentation

## 2014-10-21 DIAGNOSIS — R3989 Other symptoms and signs involving the genitourinary system: Secondary | ICD-10-CM | POA: Diagnosis present

## 2014-10-21 DIAGNOSIS — F1590 Other stimulant use, unspecified, uncomplicated: Secondary | ICD-10-CM | POA: Insufficient documentation

## 2014-10-21 DIAGNOSIS — R339 Retention of urine, unspecified: Secondary | ICD-10-CM | POA: Diagnosis not present

## 2014-10-21 HISTORY — DX: Dermatitis, unspecified: L30.9

## 2014-10-21 HISTORY — DX: Nocturia: R35.1

## 2014-10-21 HISTORY — DX: Frequency of micturition: R35.0

## 2014-10-21 HISTORY — PX: CYSTO WITH HYDRODISTENSION: SHX5453

## 2014-10-21 HISTORY — DX: Personal history of other infectious and parasitic diseases: Z86.19

## 2014-10-21 HISTORY — DX: Other symptoms and signs involving the genitourinary system: R39.89

## 2014-10-21 HISTORY — DX: Urgency of urination: R39.15

## 2014-10-21 HISTORY — DX: Presence of spectacles and contact lenses: Z97.3

## 2014-10-21 LAB — POCT PREGNANCY, URINE: Preg Test, Ur: NEGATIVE

## 2014-10-21 LAB — POCT HEMOGLOBIN-HEMACUE: Hemoglobin: 12.6 g/dL (ref 12.0–15.0)

## 2014-10-21 SURGERY — CYSTOSCOPY, WITH BLADDER HYDRODISTENSION
Anesthesia: General | Site: Bladder

## 2014-10-21 MED ORDER — MIDAZOLAM HCL 2 MG/2ML IJ SOLN
INTRAMUSCULAR | Status: AC
  Filled 2014-10-21: qty 2

## 2014-10-21 MED ORDER — CIPROFLOXACIN IN D5W 400 MG/200ML IV SOLN
400.0000 mg | INTRAVENOUS | Status: AC
  Administered 2014-10-21: 400 mg via INTRAVENOUS
  Filled 2014-10-21: qty 200

## 2014-10-21 MED ORDER — PHENAZOPYRIDINE HCL 200 MG PO TABS
ORAL | Status: DC | PRN
  Administered 2014-10-21: 15 mL via INTRAVESICAL

## 2014-10-21 MED ORDER — FENTANYL CITRATE (PF) 100 MCG/2ML IJ SOLN
INTRAMUSCULAR | Status: DC | PRN
  Administered 2014-10-21: 50 ug via INTRAVENOUS

## 2014-10-21 MED ORDER — BELLADONNA ALKALOIDS-OPIUM 16.2-60 MG RE SUPP
RECTAL | Status: AC
  Filled 2014-10-21: qty 1

## 2014-10-21 MED ORDER — TRAMADOL HCL 50 MG PO TABS: 50.0000 mg | ORAL_TABLET | Freq: Four times a day (QID) | ORAL | Status: DC | PRN

## 2014-10-21 MED ORDER — SODIUM CHLORIDE 0.9 % IJ SOLN
3.0000 mL | Freq: Two times a day (BID) | INTRAMUSCULAR | Status: DC
  Filled 2014-10-21: qty 3

## 2014-10-21 MED ORDER — PROPOFOL 10 MG/ML IV BOLUS
INTRAVENOUS | Status: DC | PRN
  Administered 2014-10-21: 130 mg via INTRAVENOUS
  Administered 2014-10-21: 50 mg via INTRAVENOUS

## 2014-10-21 MED ORDER — ACETAMINOPHEN 650 MG RE SUPP
650.0000 mg | RECTAL | Status: DC | PRN
  Filled 2014-10-21: qty 1

## 2014-10-21 MED ORDER — DEXAMETHASONE SODIUM PHOSPHATE 4 MG/ML IJ SOLN
INTRAMUSCULAR | Status: DC | PRN
  Administered 2014-10-21: 10 mg via INTRAVENOUS

## 2014-10-21 MED ORDER — PHENAZOPYRIDINE HCL 200 MG PO TABS: 200.0000 mg | ORAL_TABLET | Freq: Three times a day (TID) | ORAL | Status: DC | PRN

## 2014-10-21 MED ORDER — CIPROFLOXACIN IN D5W 400 MG/200ML IV SOLN
INTRAVENOUS | Status: AC
  Filled 2014-10-21: qty 200

## 2014-10-21 MED ORDER — MIDAZOLAM HCL 5 MG/5ML IJ SOLN
INTRAMUSCULAR | Status: DC | PRN
  Administered 2014-10-21: 2 mg via INTRAVENOUS

## 2014-10-21 MED ORDER — SODIUM CHLORIDE 0.9 % IJ SOLN
3.0000 mL | INTRAMUSCULAR | Status: DC | PRN
  Filled 2014-10-21: qty 3

## 2014-10-21 MED ORDER — ONDANSETRON HCL 4 MG/2ML IJ SOLN
INTRAMUSCULAR | Status: DC | PRN
  Administered 2014-10-21: 4 mg via INTRAVENOUS

## 2014-10-21 MED ORDER — ACETAMINOPHEN 325 MG PO TABS
650.0000 mg | ORAL_TABLET | ORAL | Status: DC | PRN
  Filled 2014-10-21: qty 2

## 2014-10-21 MED ORDER — ACETAMINOPHEN 10 MG/ML IV SOLN
INTRAVENOUS | Status: DC | PRN
  Administered 2014-10-21: 1000 mg via INTRAVENOUS

## 2014-10-21 MED ORDER — FENTANYL CITRATE (PF) 100 MCG/2ML IJ SOLN
25.0000 ug | INTRAMUSCULAR | Status: DC | PRN
  Filled 2014-10-21: qty 1

## 2014-10-21 MED ORDER — LIDOCAINE HCL (CARDIAC) 20 MG/ML IV SOLN
INTRAVENOUS | Status: DC | PRN
  Administered 2014-10-21 (×2): 50 mg via INTRAVENOUS

## 2014-10-21 MED ORDER — PROMETHAZINE HCL 25 MG/ML IJ SOLN
6.2500 mg | INTRAMUSCULAR | Status: DC | PRN
  Filled 2014-10-21: qty 1

## 2014-10-21 MED ORDER — FENTANYL CITRATE (PF) 100 MCG/2ML IJ SOLN
INTRAMUSCULAR | Status: AC
  Filled 2014-10-21: qty 2

## 2014-10-21 MED ORDER — STERILE WATER FOR IRRIGATION IR SOLN
Status: DC | PRN
  Administered 2014-10-21: 3000 mL

## 2014-10-21 MED ORDER — LACTATED RINGERS IV SOLN
INTRAVENOUS | Status: DC
Start: 2014-10-21 — End: 2014-10-21
  Administered 2014-10-21: 09:00:00 via INTRAVENOUS
  Filled 2014-10-21: qty 1000

## 2014-10-21 MED ORDER — BELLADONNA ALKALOIDS-OPIUM 16.2-60 MG RE SUPP
RECTAL | Status: DC | PRN
Start: 2014-10-21 — End: 2014-10-21
  Administered 2014-10-21: 1 via RECTAL

## 2014-10-21 MED ORDER — OXYCODONE HCL 5 MG PO TABS
5.0000 mg | ORAL_TABLET | ORAL | Status: DC | PRN
  Filled 2014-10-21: qty 2

## 2014-10-21 MED ORDER — SODIUM CHLORIDE 0.9 % IV SOLN
250.0000 mL | INTRAVENOUS | Status: DC | PRN
  Filled 2014-10-21: qty 250

## 2014-10-21 SURGICAL SUPPLY — 19 items
BAG DRAIN URO-CYSTO SKYTR STRL (DRAIN) ×3 IMPLANT
CANISTER SUCT LVC 12 LTR MEDI- (MISCELLANEOUS) ×3 IMPLANT
CATH ROBINSON RED A/P 16FR (CATHETERS) ×3 IMPLANT
CLOTH BEACON ORANGE TIMEOUT ST (SAFETY) ×3 IMPLANT
ELECT REM PT RETURN 9FT ADLT (ELECTROSURGICAL) ×3
ELECTRODE REM PT RTRN 9FT ADLT (ELECTROSURGICAL) ×1 IMPLANT
GLOVE BIO SURGEON STRL SZ8 (GLOVE) ×3 IMPLANT
GLOVE INDICATOR 7.5 STRL GRN (GLOVE) ×3 IMPLANT
GLOVE INDICATOR 8.5 STRL (GLOVE) ×3 IMPLANT
GLOVE SURG SS PI 7.5 STRL IVOR (GLOVE) ×3 IMPLANT
GLOVE SURG SS PI 8.0 STRL IVOR (GLOVE) ×3 IMPLANT
GOWN STRL REUS W/ TWL XL LVL3 (GOWN DISPOSABLE) ×3 IMPLANT
GOWN STRL REUS W/TWL XL LVL3 (GOWN DISPOSABLE) ×6
NDL SAFETY ECLIPSE 18X1.5 (NEEDLE) ×2 IMPLANT
NEEDLE HYPO 18GX1.5 SHARP (NEEDLE) ×4
NS IRRIG 500ML POUR BTL (IV SOLUTION) IMPLANT
PACK CYSTO (CUSTOM PROCEDURE TRAY) ×3 IMPLANT
SYR 30ML LL (SYRINGE) ×3 IMPLANT
WATER STERILE IRR 3000ML UROMA (IV SOLUTION) ×3 IMPLANT

## 2014-10-21 NOTE — Discharge Instructions (Addendum)
CYSTOSCOPY HOME CARE INSTRUCTIONS ° °Activity: °Rest for the remainder of the day.  Do not drive or operate equipment today.  You may resume normal activities in one to two days as instructed by your physician.  ° °Meals: °Drink plenty of liquids and eat light foods such as gelatin or soup this evening.  You may return to a normal meal plan tomorrow. ° °Return to Work: °You may return to work in one to two days or as instructed by your physician. ° °Special Instructions / Symptoms: °Call your physician if any of these symptoms occur: ° ° -persistent or heavy bleeding ° -bleeding which continues after first few urination ° -large blood clots that are difficult to pass ° -urine stream diminishes or stops completely ° -fever equal to or higher than 101 degrees Farenheit. ° -cloudy urine with a strong, foul odor ° -severe pain ° °Females should always wipe from front to back after elimination.  You may feel some burning pain when you urinate.  This should disappear with time.  Applying moist heat to the lower abdomen or a hot tub bath may help relieve the pain. \ ° ° ° ° °Post Anesthesia Home Care Instructions ° °Activity: °Get plenty of rest for the remainder of the day. A responsible adult should stay with you for 24 hours following the procedure.  °For the next 24 hours, DO NOT: °-Drive a car °-Operate machinery °-Drink alcoholic beverages °-Take any medication unless instructed by your physician °-Make any legal decisions or sign important papers. ° °Meals: °Start with liquid foods such as gelatin or soup. Progress to regular foods as tolerated. Avoid greasy, spicy, heavy foods. If nausea and/or vomiting occur, drink only clear liquids until the nausea and/or vomiting subsides. Call your physician if vomiting continues. ° °Special Instructions/Symptoms: °Your throat may feel dry or sore from the anesthesia or the breathing tube placed in your throat during surgery. If this causes discomfort, gargle with warm salt  water. The discomfort should disappear within 24 hours. ° °If you had a scopolamine patch placed behind your ear for the management of post- operative nausea and/or vomiting: ° °1. The medication in the patch is effective for 72 hours, after which it should be removed.  Wrap patch in a tissue and discard in the trash. Wash hands thoroughly with soap and water. °2. You may remove the patch earlier than 72 hours if you experience unpleasant side effects which may include dry mouth, dizziness or visual disturbances. °3. Avoid touching the patch. Wash your hands with soap and water after contact with the patch. °  ° °

## 2014-10-21 NOTE — Op Note (Signed)
[name]:  Cancel, Wells               ACCOUNT NO.:  [id number]  MEDICAL RECORD NO.:  [id number]  LOCATION:                                 FACILITY:  PHYSICIAN:  Excell SeltzerJohn J. Annabell HowellsWrenn, M.D.    DATE OF BIRTH:  [birth year]  DATE OF PROCEDURE:  10/21/2014 DATE OF DISCHARGE:                              OPERATIVE REPORT   PROCEDURE:  Cystoscopy with hydrodistention, bladder instillation, and instillation of Pyridium and Marcaine.  PREOPERATIVE DIAGNOSIS:  Painful bladder, possible interstitial cystitis.  POSTOPERATIVE DIAGNOSIS:  Painful bladder with interstitial cystitis.  SURGEON:  Excell SeltzerJohn J. Annabell HowellsWrenn, M.D.  ANESTHESIA:  General.  SPECIMEN:  None.  DRAINS:  None.  BLOOD LOSS:  None.  COMPLICATIONS:  None.  INDICATIONS:  Terri Taylor is a 24 year old white female, who has a history of painful bladder and is to undergo cystoscopy and hydrodistention to rule out interstitial cystitis.  FINDINGS AND PROCEDURE:  She was given Cipro.  She was taken to the operating room where general anesthetic was induced.  She was placed in lithotomy position.  Her perineum and genitalia were prepped with Betadine solution, and she was draped in usual sterile fashion.  Cystoscopy was performed using a 22-French scope and 30-degree lens. Examination revealed a normal urethra.  The bladder wall was smooth and pale without tumor, stones, or inflammation.  Ureteral orifices were unremarkable in the normal anatomic position.  The bladder was then dilated to capacity at 80 cm of water pressure, and the bladder was then drained, her capacity under anesthesia was only about 300 mL.  An internal efflux was bloody.  A repeat cystoscopy revealed diffuse glomerulations consistent with diagnosis of interstitial cystitis.  The second hydrodistention was performed with a slightly higher pressure once again with a similar capacity detected.  At this point, the bladder was drained and the cystoscope was removed. A  14-French red rubber catheter was used to instill the bladder with 30 mL of 0.25% Marcaine with 400 mg of crushed Pyridium.  The catheter was removed after instillation.  A B and O suppository was placed.  The patient was taken down from lithotomy position.  Her anesthetic was reversed.  She was moved to recovery room in stable condition.  There were no complications.     Excell SeltzerJohn J. Annabell HowellsWrenn, M.D.     JJW/MEDQ  D:  10/21/2014  T:  10/21/2014  Job:  [id number]

## 2014-10-21 NOTE — Interval H&P Note (Signed)
History and Physical Interval Note:  10/21/2014 9:01 AM  Terri Taylor  has presented today for surgery, with the diagnosis of bladder pain  The various methods of treatment have been discussed with the patient and family. After consideration of risks, benefits and other options for treatment, the patient has consented to  Procedure(s): CYSTOSCOPY/HYDRODISTENSION MARCAINE AND PYRIDIUM  (N/A) as a surgical intervention .  The patient's history has been reviewed, patient examined, no change in status, stable for surgery.  I have reviewed the patient's chart and labs.  Questions were answered to the patient's satisfaction.     Kahiau Schewe J

## 2014-10-21 NOTE — Brief Op Note (Signed)
10/21/2014  10:00 AM  PATIENT:  Scarlett Yarbough  24 y.o. female  PRE-OPERATIVE DIAGNOSIS:  bladder pain  POST-OPERATIVE DIAGNOSIS:  bladder pain  PROCEDURE:  Procedure(s): CYSTOSCOPY/HYDRODISTENSION MARCAINE AND PYRIDIUM  (N/A)  SURGEON:  Surgeon(s) and Role:    * Bjorn PippinJohn Kaelen Brennan, MD - Primary  PHYSICIAN ASSISTANT:   ASSISTANTS: none   ANESTHESIA:   general  EBL:     BLOOD ADMINISTERED:none  DRAINS: none   LOCAL MEDICATIONS USED:  MARCAINE     SPECIMEN:  No Specimen  DISPOSITION OF SPECIMEN:  N/A  COUNTS:  YES  TOURNIQUET:  * No tourniquets in log *  DICTATION: .Other Dictation: Dictation Number [id number]  PLAN OF CARE: Discharge to home after PACU  PATIENT DISPOSITION:  PACU - hemodynamically stable.   Delay start of Pharmacological VTE agent (>24hrs) due to surgical blood loss or risk of bleeding: not applicable

## 2014-10-21 NOTE — Anesthesia Procedure Notes (Signed)
Procedure Name: LMA Insertion Date/Time: 10/21/2014 9:43 AM Performed by: Tyrone NineSAUVE, Calyssa Zobrist F Pre-anesthesia Checklist: Patient identified, Timeout performed, Emergency Drugs available, Suction available and Patient being monitored Patient Re-evaluated:Patient Re-evaluated prior to inductionOxygen Delivery Method: Circle system utilized Preoxygenation: Pre-oxygenation with 100% oxygen Intubation Type: IV induction Ventilation: Mask ventilation without difficulty LMA: LMA inserted LMA Size: 4.0 Number of attempts: 1 Placement Confirmation: positive ETCO2 and breath sounds checked- equal and bilateral Tube secured with: Tape Dental Injury: Teeth and Oropharynx as per pre-operative assessment

## 2014-10-21 NOTE — Transfer of Care (Signed)
Immediate Anesthesia Transfer of Care Note  Patient: Terri Taylor  Procedure(s) Performed: Procedure(s): CYSTOSCOPY/HYDRODISTENSION MARCAINE AND PYRIDIUM  (N/A)  Patient Location: PACU  Anesthesia Type:General  Level of Consciousness: awake, alert , oriented and patient cooperative  Airway & Oxygen Therapy: Patient Spontanous Breathing and Patient connected to nasal cannula oxygen  Post-op Assessment: Report given to RN and Post -op Vital signs reviewed and stable  Post vital signs: Reviewed and stable  Last Vitals:  Filed Vitals:   10/21/14 0857  BP: 139/85  Pulse: 91  Temp: 36.8 C  Resp: 16    Complications: No apparent anesthesia complications

## 2014-10-21 NOTE — Anesthesia Preprocedure Evaluation (Signed)
Anesthesia Evaluation  Patient identified by MRN, date of birth, ID band Patient awake    Reviewed: Allergy & Precautions, NPO status , Patient's Chart, lab work & pertinent test results  Airway Mallampati: II  TM Distance: >3 FB Neck ROM: Full    Dental no notable dental hx.    Pulmonary neg pulmonary ROS,  breath sounds clear to auscultation  Pulmonary exam normal       Cardiovascular negative cardio ROS Normal cardiovascular examRhythm:Regular Rate:Normal     Neuro/Psych negative neurological ROS  negative psych ROS   GI/Hepatic negative GI ROS, Neg liver ROS,   Endo/Other  negative endocrine ROS  Renal/GU negative Renal ROS  negative genitourinary   Musculoskeletal negative musculoskeletal ROS (+)   Abdominal   Peds negative pediatric ROS (+)  Hematology negative hematology ROS (+)   Anesthesia Other Findings   Reproductive/Obstetrics negative OB ROS                             Anesthesia Physical Anesthesia Plan  ASA: I  Anesthesia Plan: General   Post-op Pain Management:    Induction: Intravenous  Airway Management Planned: LMA  Additional Equipment:   Intra-op Plan:   Post-operative Plan: Extubation in OR  Informed Consent: I have reviewed the patients History and Physical, chart, labs and discussed the procedure including the risks, benefits and alternatives for the proposed anesthesia with the patient or authorized representative who has indicated his/her understanding and acceptance.   Dental advisory given  Plan Discussed with: CRNA  Anesthesia Plan Comments:         Anesthesia Quick Evaluation  

## 2014-10-22 ENCOUNTER — Encounter (HOSPITAL_BASED_OUTPATIENT_CLINIC_OR_DEPARTMENT_OTHER): Payer: Self-pay | Admitting: Urology

## 2014-10-22 NOTE — Anesthesia Postprocedure Evaluation (Signed)
  Anesthesia Post-op Note  Patient: Terri Taylor  Procedure(s) Performed: Procedure(s) (LRB): CYSTOSCOPY/HYDRODISTENSION MARCAINE AND PYRIDIUM  (N/A)  Patient Location: PACU  Anesthesia Type: General  Level of Consciousness: awake and alert   Airway and Oxygen Therapy: Patient Spontanous Breathing  Post-op Pain: mild  Post-op Assessment: Post-op Vital signs reviewed, Patient's Cardiovascular Status Stable, Respiratory Function Stable, Patent Airway and No signs of Nausea or vomiting  Last Vitals:  Filed Vitals:   10/21/14 1124  BP: 107/70  Pulse: 67  Temp: 36.8 C  Resp: 20    Post-op Vital Signs: stable   Complications: No apparent anesthesia complications

## 2016-07-28 ENCOUNTER — Emergency Department (HOSPITAL_BASED_OUTPATIENT_CLINIC_OR_DEPARTMENT_OTHER)
Admission: EM | Admit: 2016-07-28 | Discharge: 2016-07-28 | Disposition: A | Discharge status: Home / Self Care | Payer: BLUE CROSS/BLUE SHIELD | Attending: Emergency Medicine | Admitting: Emergency Medicine

## 2016-07-28 ENCOUNTER — Encounter (HOSPITAL_BASED_OUTPATIENT_CLINIC_OR_DEPARTMENT_OTHER): Payer: Self-pay | Admitting: Emergency Medicine

## 2016-07-28 DIAGNOSIS — H579 Unspecified disorder of eye and adnexa: Secondary | ICD-10-CM | POA: Diagnosis present

## 2016-07-28 DIAGNOSIS — H109 Unspecified conjunctivitis: Secondary | ICD-10-CM | POA: Insufficient documentation

## 2016-07-28 MED ORDER — SULFACETAMIDE SODIUM 10 % OP SOLN: 1.0000 [drp] | Freq: Four times a day (QID) | OPHTHALMIC | 0 refills | Status: DC

## 2016-07-28 MED FILL — SULFACETAMIDE 10% EYE DROPS: 10 | 30 days supply | Qty: 15 | Fill #0

## 2016-07-28 NOTE — ED Notes (Signed)
Pt verbalized understanding of discharge instructions and denies any further questions at this time.   

## 2016-07-28 NOTE — ED Notes (Signed)
ED Provider at bedside. 

## 2016-07-28 NOTE — ED Triage Notes (Signed)
Pt having eye irritation and itching for a few weeks.  Pt did get some bleach water in her eye originally but washed them out.  Then last night she had urine splash in her eye.

## 2016-07-28 NOTE — Discharge Instructions (Signed)
Bleph-10 drops as prescribed.  Refrain from contact lens use for the next week.  Follow-up with your eye doctor if not improving in the next week.

## 2016-07-28 NOTE — ED Notes (Signed)
Pt reports she needs note for work. Sat/Sun due to not being able to wear contact lenses. [employer] notified and is ok with work note.

## 2016-07-31 NOTE — ED Provider Notes (Signed)
[facility]   CSN: [id number] Arrival date & time: 07/28/16  7829     History   Chief Complaint Chief Complaint  Patient presents with  . Eye Problem    HPI Terri Taylor is a 26 y.o. female.  Patient is a 26 year old female who presents with complaints of her eyes burning. She denies any new contacts or exposures. She does wear contact lenses, however denies wearing them for prolonged periods of time or sleeping with them.   The history is provided by the patient.  Eye Problem   This is a new problem. The current episode started more than 1 week ago. The problem occurs constantly. The problem has been gradually worsening. There is a problem in both eyes. The pain is moderate.    Past Medical History:  Diagnosis Date  . Bladder pain   . Eczema   . Frequency of urination   . History of gonorrhea   . Nocturia   . Urgency of urination   . Wears contact lenses     There are no active problems to display for this patient.   Past Surgical History:  Procedure Laterality Date  . CYSTO WITH HYDRODISTENSION N/A 10/21/2014   Procedure: CYSTOSCOPY/HYDRODISTENSION MARCAINE AND PYRIDIUM ;  Surgeon: Bjorn Pippin, MD;  Location: [facility];  Service: Urology;  Laterality: N/A;  . LIPOSUCTION  07/25/13   fat injections to face    OB History    No data available       Home Medications    Prior to Admission medications   Medication Sig Start Date End Date Taking? Authorizing Provider  sulfacetamide (BLEPH-10) 10 % ophthalmic solution Place 1-2 drops into both eyes 4 (four) times daily. 07/28/16   Geoffery Lyons, MD    Family History No family history on file.  Social History Social History  Substance Use Topics  . Smoking status: Never Smoker  . Smokeless tobacco: Never Used  . Alcohol use No     Allergies   Patient has no known allergies.   Review of Systems Review of Systems  All other systems reviewed and are  negative.    Physical Exam Updated Vital Signs BP 129/85 (BP Location: Left Arm)   Pulse 78   Temp 98.1 F (36.7 C) (Oral)   Resp 16   Ht 5\' 5"  (1.651 m)   Wt 130 lb (59 kg)   LMP 07/12/2016   SpO2 100%   BMI 21.63 kg/m   Physical Exam  Constitutional: She is oriented to person, place, and time. She appears well-developed and well-nourished. No distress.  HENT:  Head: Normocephalic and atraumatic.  Eyes: EOM are normal. Pupils are equal, round, and reactive to light.  Conjunctiva slightly injected, however corneas appear clear.  Neck: Normal range of motion. Neck supple.  Cardiovascular: Normal rate and regular rhythm.  Exam reveals no gallop and no friction rub.   No murmur heard. Pulmonary/Chest: Effort normal and breath sounds normal. No respiratory distress. She has no wheezes.  Abdominal: Soft. Bowel sounds are normal. She exhibits no distension. There is no tenderness.  Musculoskeletal: Normal range of motion.  Neurological: She is alert and oriented to person, place, and time.  Skin: Skin is warm and dry. She is not diaphoretic.  Nursing note and vitals reviewed.    ED Treatments / Results  Labs (all labs ordered are listed, but only abnormal results are displayed) Labs Reviewed - No data to display  EKG  EKG  Interpretation None       Radiology No results found.  Procedures Procedures (including critical care time)  Medications Ordered in ED Medications - No data to display   Initial Impression / Assessment and Plan / ED Course  I have reviewed the triage vital signs and the nursing notes.  Pertinent labs & imaging results that were available during my care of the patient were reviewed by me and considered in my medical decision making (see chart for details).  Patient with bilateral eye burning. This is possibly related to conjunctivitis. She will be treated with Bleph-10. She was also advised to not wear her contacts for the next week. She is to  follow-up with her eye doctor if not improving.  Final Clinical Impressions(s) / ED Diagnoses   Final diagnoses:  Conjunctivitis of both eyes, unspecified conjunctivitis type    New Prescriptions Discharge Medication List as of 07/28/2016  8:20 AM    START taking these medications   Details  sulfacetamide (BLEPH-10) 10 % ophthalmic solution Place 1-2 drops into both eyes 4 (four) times daily., Starting Thu 07/28/2016, Print         Geoffery Lyonsouglas Marvis Bakken, MD 07/31/16 [id number]

## 2019-02-15 ENCOUNTER — Encounter (HOSPITAL_COMMUNITY): Payer: Self-pay

## 2019-02-15 ENCOUNTER — Emergency Department (HOSPITAL_COMMUNITY)
Admission: EM | Admit: 2019-02-15 | Discharge: 2019-02-16 | Disposition: A | Discharge status: Home / Self Care | Payer: BLUE CROSS/BLUE SHIELD | Attending: Emergency Medicine | Admitting: Emergency Medicine

## 2019-02-15 DIAGNOSIS — Z5321 Procedure and treatment not carried out due to patient leaving prior to being seen by health care provider: Secondary | ICD-10-CM | POA: Insufficient documentation

## 2019-02-15 DIAGNOSIS — H5710 Ocular pain, unspecified eye: Secondary | ICD-10-CM | POA: Diagnosis present

## 2019-02-15 NOTE — ED Triage Notes (Signed)
C/o eye pain for two weeks with visual changes for a couple days.  Dr Manuella Ghazi referred to ER for MRI for further evaluation.  Patient has paperwork from Dr Manuella Ghazi

## 2019-02-16 NOTE — ED Notes (Signed)
Patient advised to stay, decided to leave anyways.   

## 2019-02-17 ENCOUNTER — Inpatient Hospital Stay (HOSPITAL_COMMUNITY)
Admission: EM | Admit: 2019-02-17 | Discharge: 2019-02-21 | DRG: 123 | Disposition: A | Discharge status: Home / Self Care | Payer: BLUE CROSS/BLUE SHIELD | Attending: Internal Medicine | Admitting: Internal Medicine

## 2019-02-17 ENCOUNTER — Other Ambulatory Visit: Payer: Self-pay

## 2019-02-17 ENCOUNTER — Emergency Department (HOSPITAL_COMMUNITY): Payer: BLUE CROSS/BLUE SHIELD

## 2019-02-17 ENCOUNTER — Encounter (HOSPITAL_COMMUNITY): Payer: Self-pay

## 2019-02-17 DIAGNOSIS — H538 Other visual disturbances: Secondary | ICD-10-CM | POA: Diagnosis present

## 2019-02-17 DIAGNOSIS — Z79899 Other long term (current) drug therapy: Secondary | ICD-10-CM

## 2019-02-17 DIAGNOSIS — Z973 Presence of spectacles and contact lenses: Secondary | ICD-10-CM

## 2019-02-17 DIAGNOSIS — H469 Unspecified optic neuritis: Secondary | ICD-10-CM | POA: Diagnosis present

## 2019-02-17 DIAGNOSIS — Z20828 Contact with and (suspected) exposure to other viral communicable diseases: Secondary | ICD-10-CM | POA: Diagnosis present

## 2019-02-17 DIAGNOSIS — H5712 Ocular pain, left eye: Secondary | ICD-10-CM | POA: Diagnosis present

## 2019-02-17 DIAGNOSIS — R030 Elevated blood-pressure reading, without diagnosis of hypertension: Secondary | ICD-10-CM | POA: Diagnosis present

## 2019-02-17 LAB — BASIC METABOLIC PANEL
Anion gap: 9 (ref 5–15)
BUN: 10 mg/dL (ref 6–20)
CO2: 23 mmol/L (ref 22–32)
Calcium: 9.7 mg/dL (ref 8.9–10.3)
Chloride: 106 mmol/L (ref 98–111)
Creatinine, Ser: 0.64 mg/dL (ref 0.44–1.00)
GFR calc Af Amer: >60 mL/min (ref >60)
GFR calc non Af Amer: >60 mL/min (ref >60)
Glucose, Bld: 96 mg/dL (ref 70–99)
Potassium: 3.7 mmol/L (ref 3.5–5.1)
Sodium: 138 mmol/L (ref 135–145)

## 2019-02-17 LAB — POC URINE PREG, ED: Preg Test, Ur: NEGATIVE

## 2019-02-17 LAB — CBC WITH DIFFERENTIAL/PLATELET
Abs Immature Granulocytes: 0.01 10*3/uL (ref 0.00–0.07)
Basophils Absolute: 0 10*3/uL (ref 0.0–0.1)
Basophils Relative: 0 %
Eosinophils Absolute: 0.1 10*3/uL (ref 0.0–0.5)
Eosinophils Relative: 3 %
HCT: 39.3 % (ref 36.0–46.0)
Hemoglobin: 13.9 g/dL (ref 12.0–15.0)
Immature Granulocytes: 0 %
Lymphocytes Relative: 50 %
Lymphs Abs: 2.2 10*3/uL (ref 0.7–4.0)
MCH: 31.9 pg (ref 26.0–34.0)
MCHC: 35.4 g/dL (ref 30.0–36.0)
MCV: 90.1 fL (ref 80.0–100.0)
Monocytes Absolute: 0.3 10*3/uL (ref 0.1–1.0)
Monocytes Relative: 6 %
Neutro Abs: 1.8 10*3/uL (ref 1.7–7.7)
Neutrophils Relative %: 41 %
Platelets: 285 10*3/uL (ref 150–400)
RBC: 4.36 MIL/uL (ref 3.87–5.11)
RDW: 12.6 % (ref 11.5–15.5)
WBC: 4.5 10*3/uL (ref 4.0–10.5)
nRBC: 0 % (ref 0.0–0.2)

## 2019-02-17 LAB — C-REACTIVE PROTEIN: CRP: <0.8 mg/dL (ref <1.0)

## 2019-02-17 LAB — SEDIMENTATION RATE: Sed Rate: 15 mm/hr (ref 0–22)

## 2019-02-17 IMAGING — MR MR HEAD WO/W CM
9 of 17 series · 26 of 48 positions shown · IV contrast (gadavist)
Comparison: None.

CLINICAL DATA: Optic neuritis.  Blurred vision for 1 week.

EXAM:
MRI HEAD AND ORBITS WITHOUT AND WITH CONTRAST
TECHNIQUE: Multiplanar, multiecho pulse sequences of the brain and surrounding
structures were obtained without and with intravenous contrast.
Multiplanar, multiecho pulse sequences of the orbits and surrounding
structures were obtained including fat saturation techniques, before
and after intravenous contrast administration.
CONTRAST:  5mL GADAVIST GADOBUTROL 1 MMOL/ML IV SOLN

[Series 3: DWI · axial · 3.0mm · 1.09mm/px · z∈[-58,+93]mm · 7 of 104 slices shown (1 of 2)]
[im 1/104]
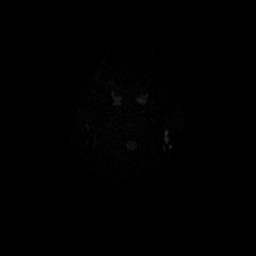
[im 18/104]
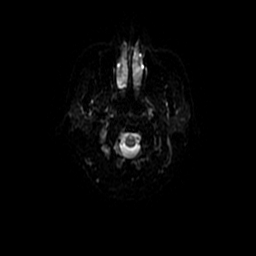
[im 35/104]
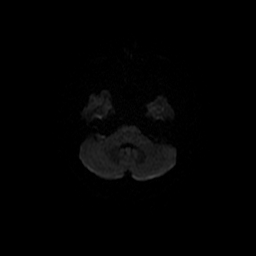
[im 52/104]
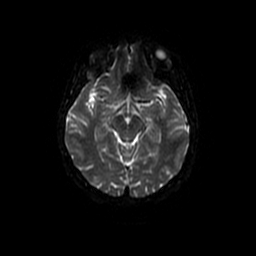
[im 69/104]
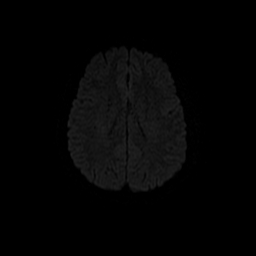
[im 86/104]
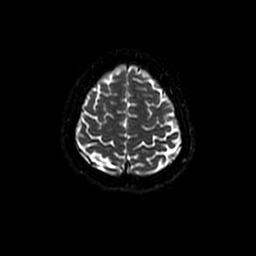
[im 104/104]
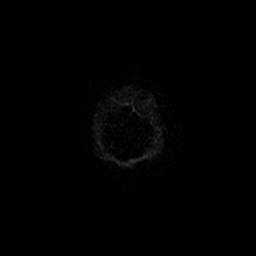

[Series 5: FLAIR · axial · 3.0mm · 0.45mm/px · 1 of 24 slices shown]
[im 1/24]
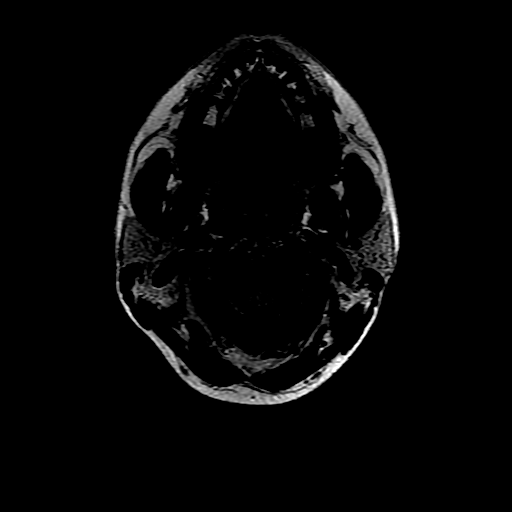

[Series 9: T2 · axial · 5.0mm · 0.45mm/px · z∈[-49,+87]mm · 2 of 24 slices shown]
[im 1/24]
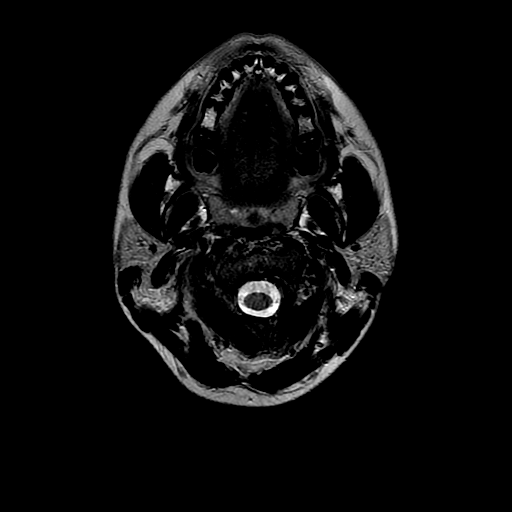
[im 24/24]
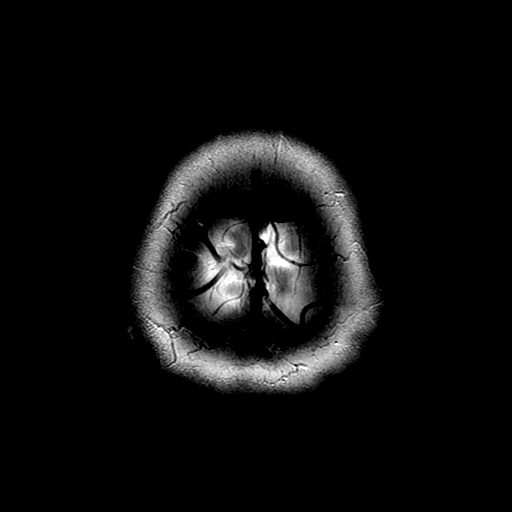

[Series 13: T2 fat-sat · axial · 3.0mm · 0.35mm/px · 1 of 18 slices shown (1 of 2)]
[im 1/18]
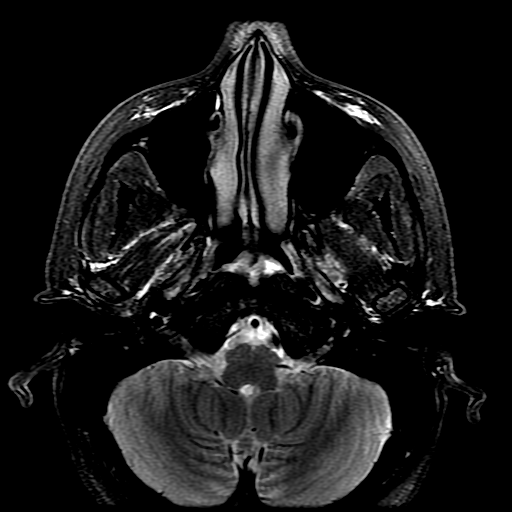

[Series 14: T2 fat-sat · coronal · 3.0mm · 0.35mm/px · 3 of 34 slices shown (2 of 2)]
[im 1/34]
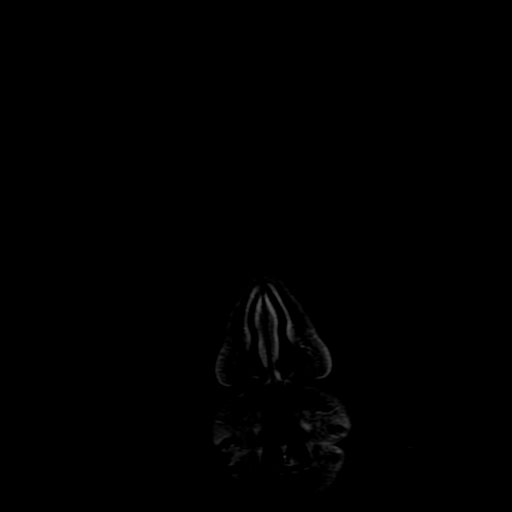
[im 17/34]
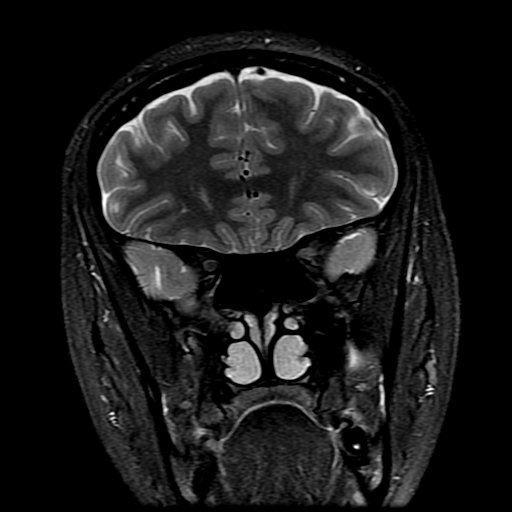
[im 34/34]
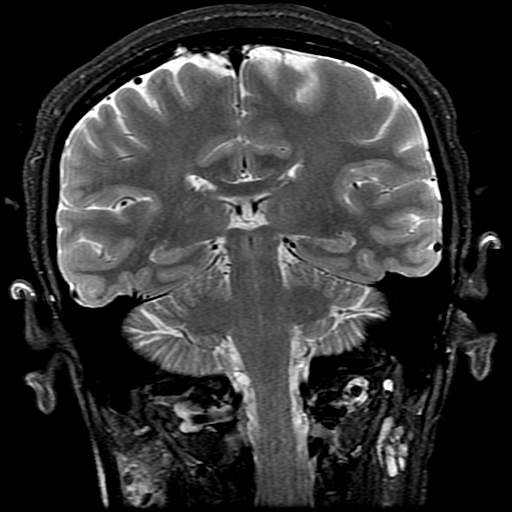

[Series 15: T1 post-contrast · coronal · 5.0mm · 0.39mm/px · 2 of 28 slices shown (1 of 3)]
[im 1/28]
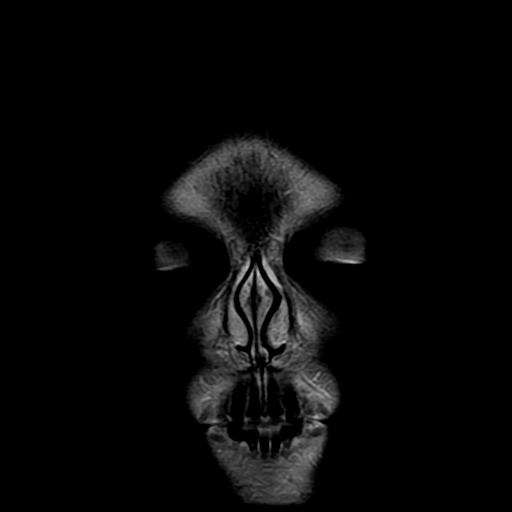
[im 28/28]
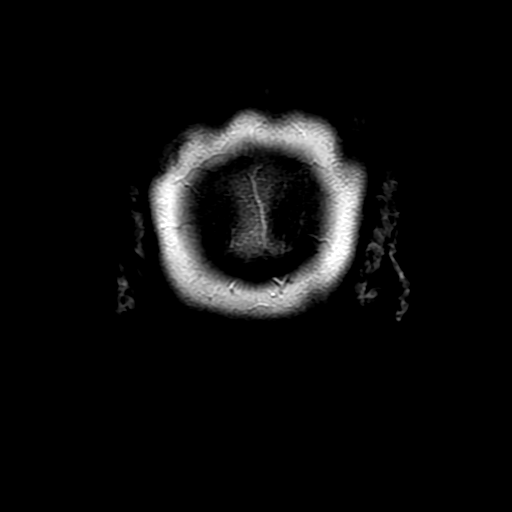

[Series 17: T1 post-contrast · sagittal · 5.0mm · 0.47mm/px · 2 of 25 slices shown (2 of 3)]
[im 1/25]
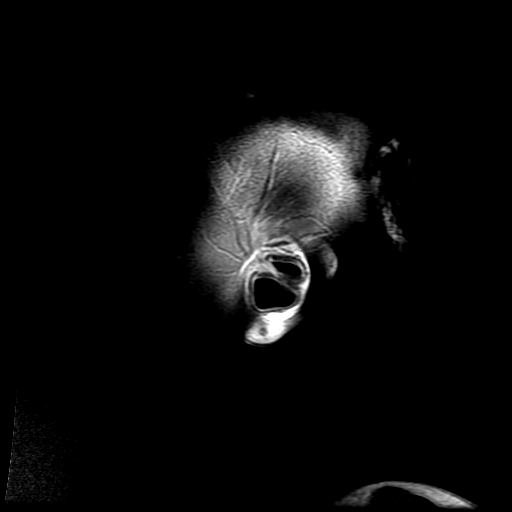
[im 25/25]
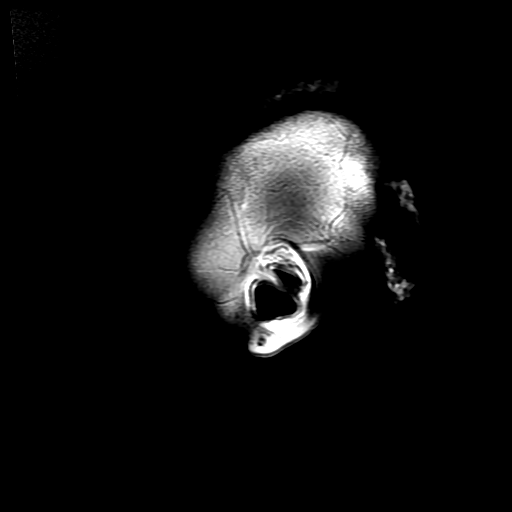

[Series 18: T1 post-contrast · axial · 3.0mm · 0.45mm/px · z∈[-53,+92]mm · 4 of 50 slices shown (3 of 3)]
[im 1/50]
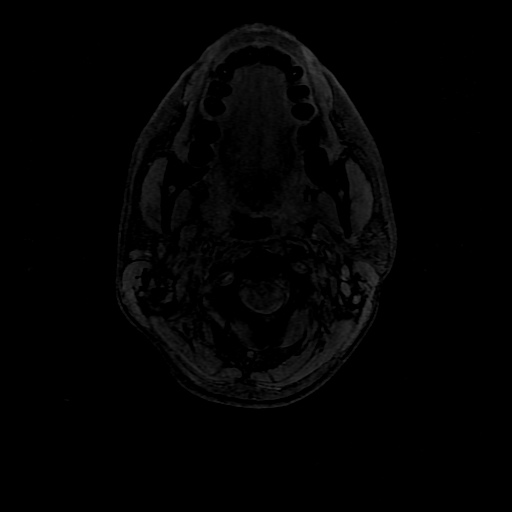
[im 17/50]
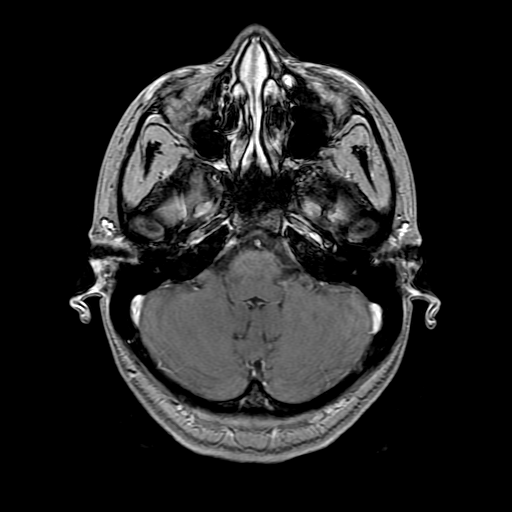
[im 33/50]
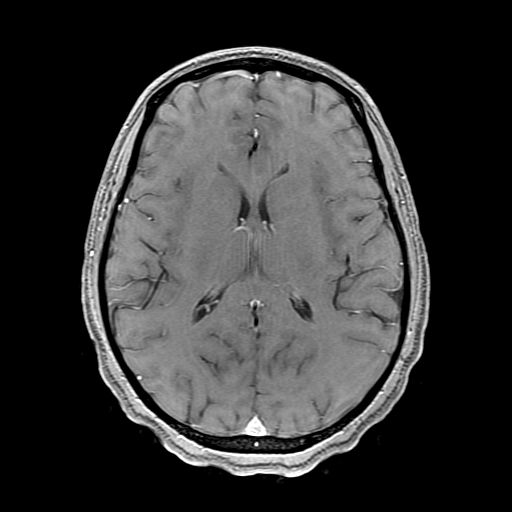
[im 50/50]
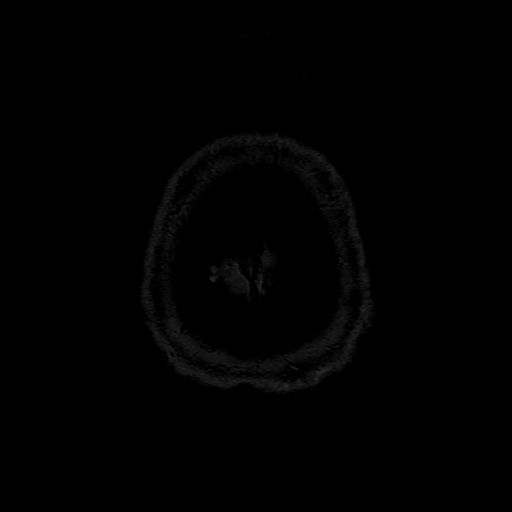

[Series 300: DWI · axial · 3.0mm · 1.09mm/px · z∈[-58,+93]mm · 4 of 52 slices shown (2 of 2)]
[im 1/52]
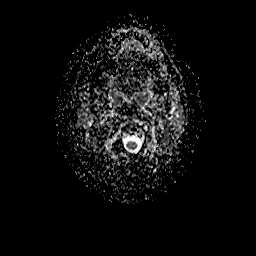
[im 18/52]
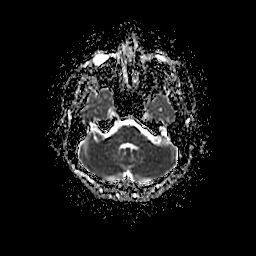
[im 35/52]
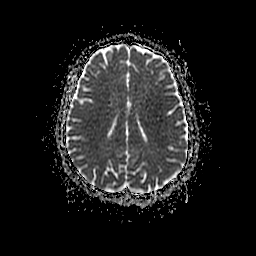
[im 52/52]
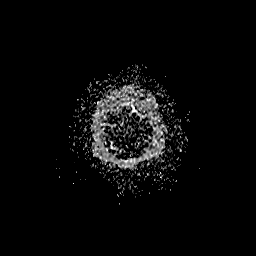

[26 of 48 positions shown; findings below may reference images not displayed]

FINDINGS: MRI HEAD FINDINGS

Brain: There is no evidence of acute infarct, intracranial
hemorrhage, mass, midline shift, or extra-axial fluid collection.
The ventricles and sulci are normal. The brain is normal in signal.
No abnormal enhancement is identified.

Vascular: Major intracranial vascular flow voids are preserved.

Skull and upper cervical spine: Unremarkable bone marrow signal.

Other: None.

MRI ORBITS FINDINGS

Orbits: Mild motion artifact and incomplete fat suppression
primarily in the left orbit limit assessment. However, there is
still the suggestion of true abnormal T2 hyperintensity and
enhancement associated with the intraorbital and canalicular
segments of the left optic nerve with the left optic nerve also
appearing mildly enlarged. The right optic nerve is normal in
appearance. No orbital mass is identified. The globes appear intact.
The extraocular muscles and lacrimal glands are symmetric and normal
in appearance.

Visualized sinuses: Minimal mucosal thickening in the paranasal
sinuses. Clear mastoid air cells.

Soft tissues: Unremarkable.

Limited intracranial: Unremarkable appearance of the optic chiasm
and pituitary gland.
IMPRESSION: 1. Findings suspicious for left optic neuritis.
2. Unremarkable appearance of the brain.

## 2019-02-17 IMAGING — MR MR ORBITS WO/W CM
9 of 17 series · 26 of 48 positions shown · IV contrast (gadavist)
Comparison: None.

CLINICAL DATA: Optic neuritis.  Blurred vision for 1 week.

EXAM:
MRI HEAD AND ORBITS WITHOUT AND WITH CONTRAST
TECHNIQUE: Multiplanar, multiecho pulse sequences of the brain and surrounding
structures were obtained without and with intravenous contrast.
Multiplanar, multiecho pulse sequences of the orbits and surrounding
structures were obtained including fat saturation techniques, before
and after intravenous contrast administration.
CONTRAST:  5mL GADAVIST GADOBUTROL 1 MMOL/ML IV SOLN

[Series 3: DWI · axial · 3.0mm · 1.09mm/px · z∈[-58,+93]mm · 7 of 104 slices shown (1 of 2)]
[im 1/104]
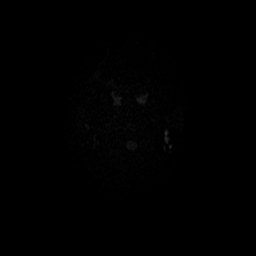
[im 18/104]
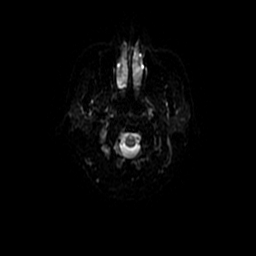
[im 35/104]
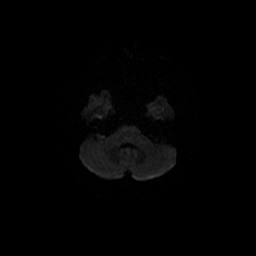
[im 52/104]
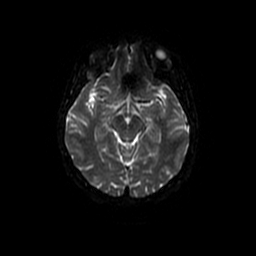
[im 69/104]
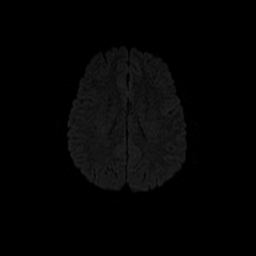
[im 86/104]
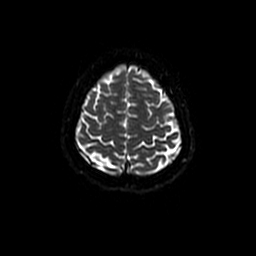
[im 104/104]
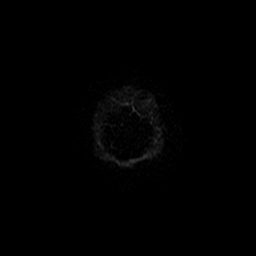

[Series 5: FLAIR · axial · 3.0mm · 0.45mm/px · 1 of 24 slices shown]
[im 1/24]
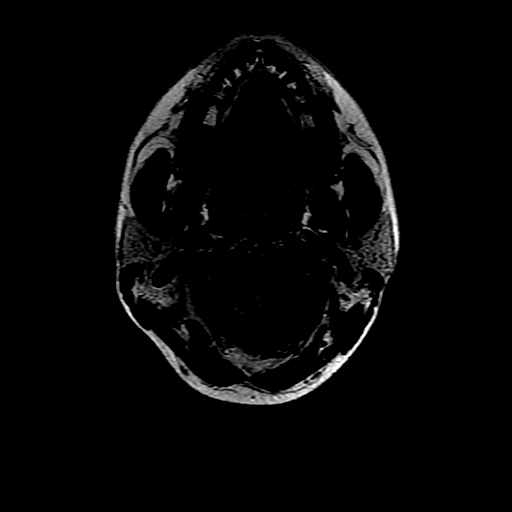

[Series 9: T2 · axial · 5.0mm · 0.45mm/px · z∈[-49,+87]mm · 2 of 24 slices shown]
[im 1/24]
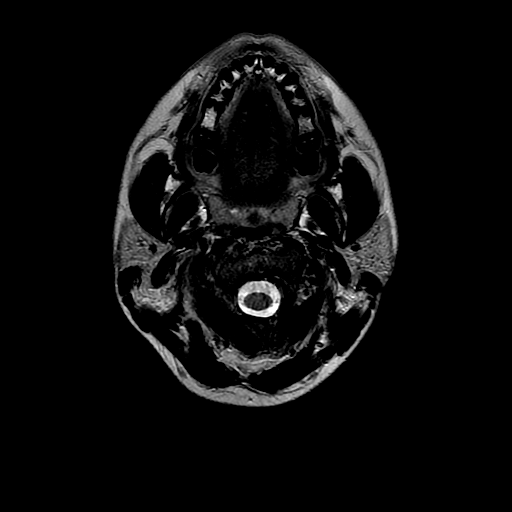
[im 24/24]
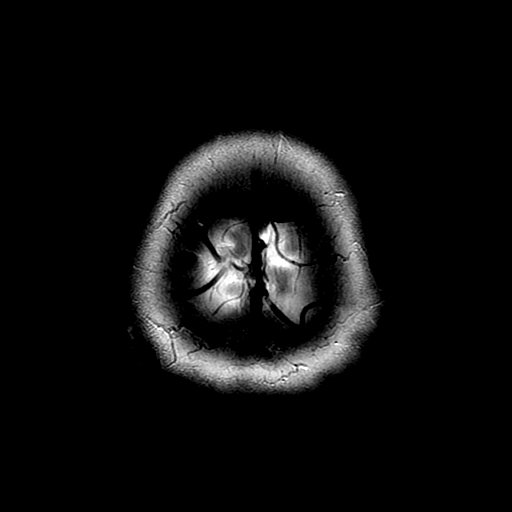

[Series 13: T2 fat-sat · axial · 3.0mm · 0.35mm/px · 1 of 18 slices shown (1 of 2)]
[im 1/18]
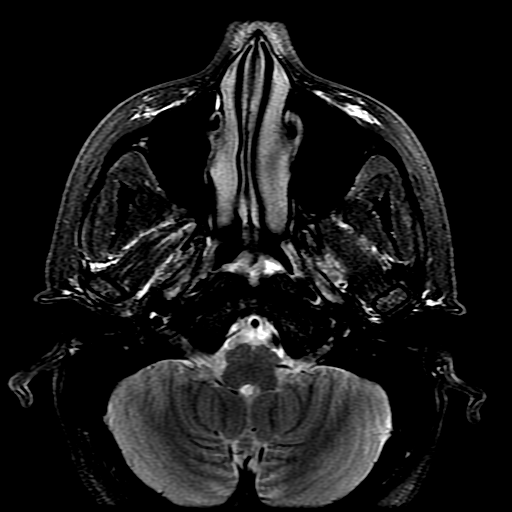

[Series 14: T2 fat-sat · coronal · 3.0mm · 0.35mm/px · 3 of 34 slices shown (2 of 2)]
[im 1/34]
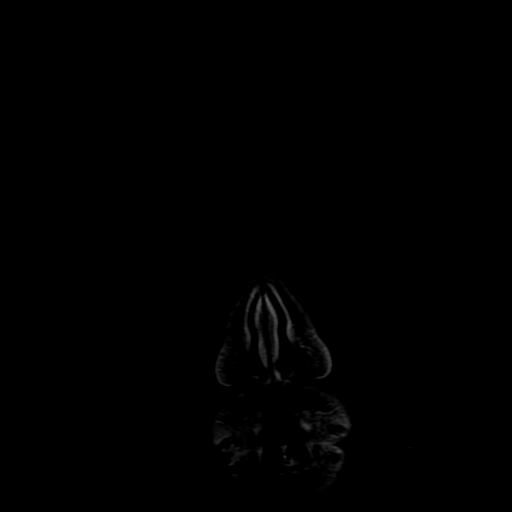
[im 17/34]
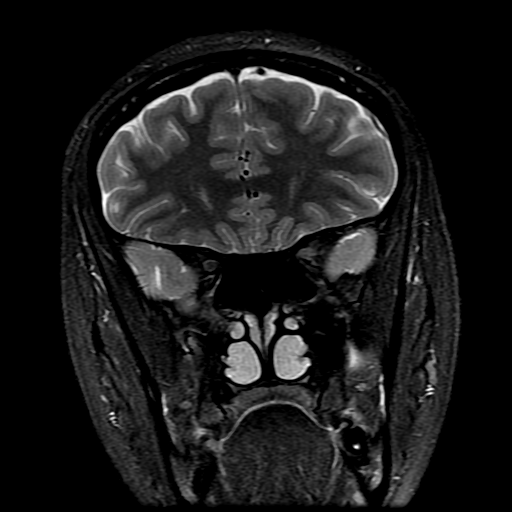
[im 34/34]
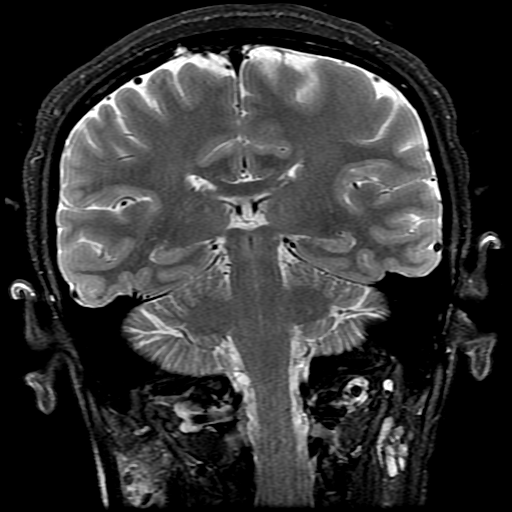

[Series 15: T1 post-contrast · coronal · 5.0mm · 0.39mm/px · 2 of 28 slices shown (1 of 3)]
[im 1/28]
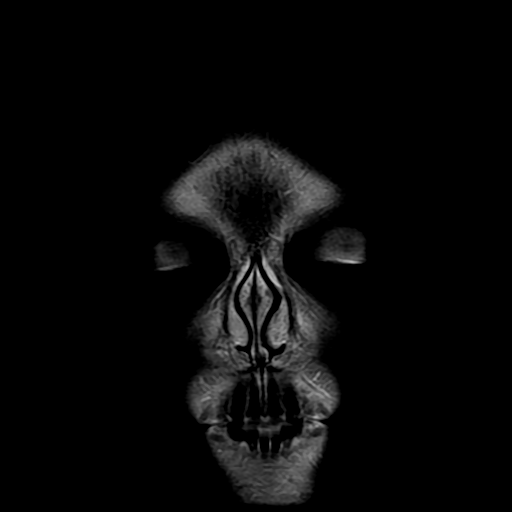
[im 28/28]
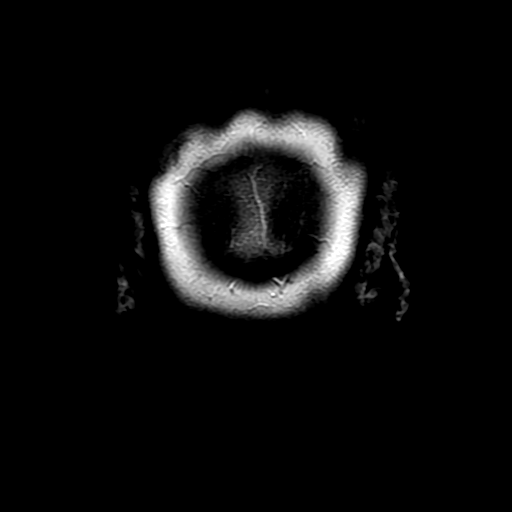

[Series 17: T1 post-contrast · sagittal · 5.0mm · 0.47mm/px · 2 of 25 slices shown (2 of 3)]
[im 1/25]
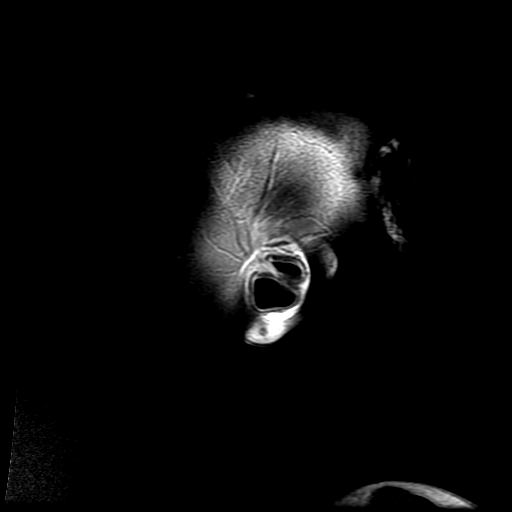
[im 25/25]
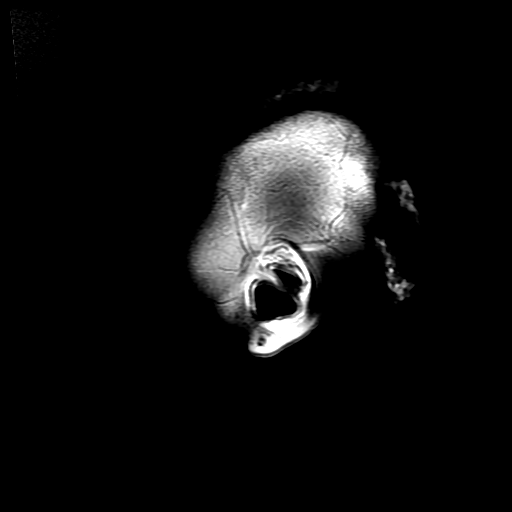

[Series 18: T1 post-contrast · axial · 3.0mm · 0.45mm/px · z∈[-53,+92]mm · 4 of 50 slices shown (3 of 3)]
[im 1/50]
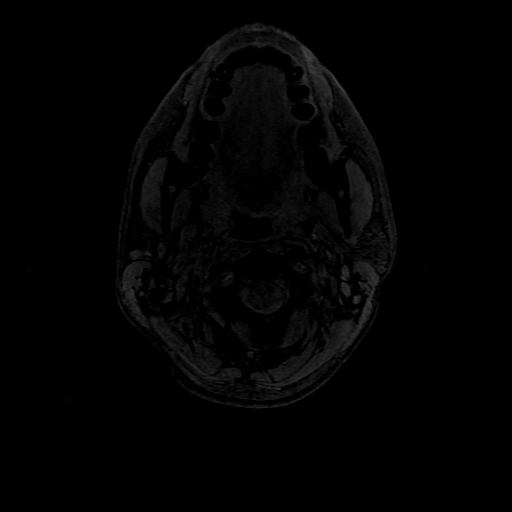
[im 17/50]
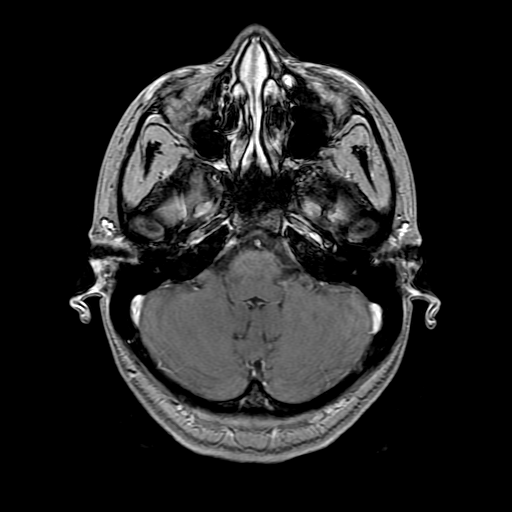
[im 33/50]
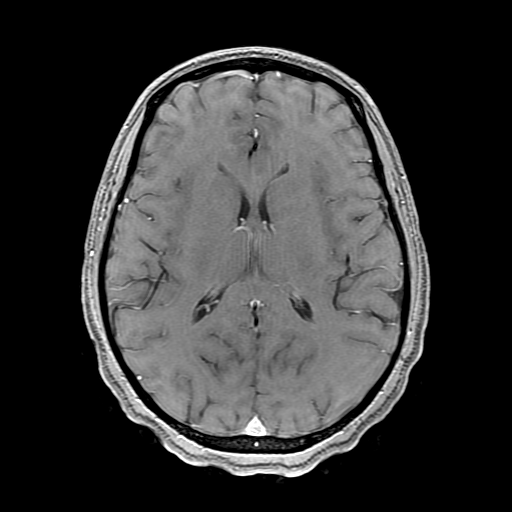
[im 50/50]
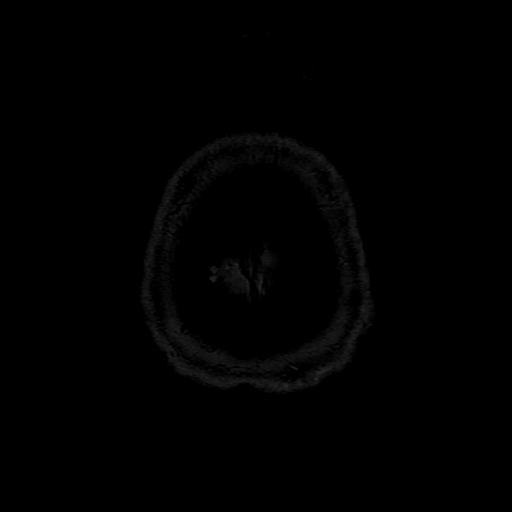

[Series 300: DWI · axial · 3.0mm · 1.09mm/px · z∈[-58,+93]mm · 4 of 52 slices shown (2 of 2)]
[im 1/52]
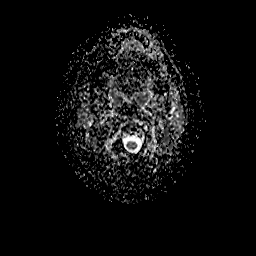
[im 18/52]
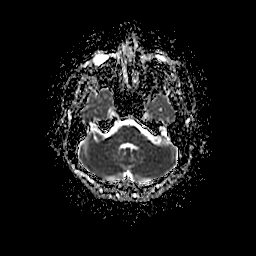
[im 35/52]
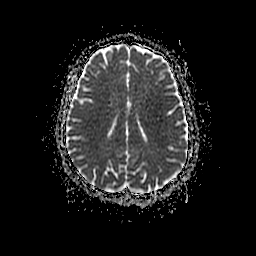
[im 52/52]
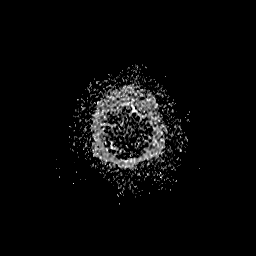

[26 of 48 positions shown; findings below may reference images not displayed]

FINDINGS: MRI HEAD FINDINGS

Brain: There is no evidence of acute infarct, intracranial
hemorrhage, mass, midline shift, or extra-axial fluid collection.
The ventricles and sulci are normal. The brain is normal in signal.
No abnormal enhancement is identified.

Vascular: Major intracranial vascular flow voids are preserved.

Skull and upper cervical spine: Unremarkable bone marrow signal.

Other: None.

MRI ORBITS FINDINGS

Orbits: Mild motion artifact and incomplete fat suppression
primarily in the left orbit limit assessment. However, there is
still the suggestion of true abnormal T2 hyperintensity and
enhancement associated with the intraorbital and canalicular
segments of the left optic nerve with the left optic nerve also
appearing mildly enlarged. The right optic nerve is normal in
appearance. No orbital mass is identified. The globes appear intact.
The extraocular muscles and lacrimal glands are symmetric and normal
in appearance.

Visualized sinuses: Minimal mucosal thickening in the paranasal
sinuses. Clear mastoid air cells.

Soft tissues: Unremarkable.

Limited intracranial: Unremarkable appearance of the optic chiasm
and pituitary gland.
IMPRESSION: 1. Findings suspicious for left optic neuritis.
2. Unremarkable appearance of the brain.

## 2019-02-17 MED ORDER — ONDANSETRON HCL 4 MG PO TABS: 4.0000 mg | ORAL_TABLET | Freq: Four times a day (QID) | ORAL | Status: DC | PRN

## 2019-02-17 MED ORDER — GADOBUTROL 1 MMOL/ML IV SOLN
5.0000 mL | Freq: Once | INTRAVENOUS | Status: AC | PRN
  Administered 2019-02-17: 5 mL via INTRAVENOUS

## 2019-02-17 MED ORDER — ONDANSETRON HCL 4 MG/2ML IJ SOLN: 4.0000 mg | Freq: Four times a day (QID) | INTRAMUSCULAR | Status: DC | PRN

## 2019-02-17 MED ORDER — HYDRALAZINE HCL 20 MG/ML IJ SOLN: 10.0000 mg | INTRAMUSCULAR | Status: DC | PRN

## 2019-02-17 MED ORDER — SODIUM CHLORIDE 0.9 % IV SOLN
1000.0000 mg | Freq: Every day | INTRAVENOUS | Status: AC
  Administered 2019-02-18 – 2019-02-21 (×4): 1000 mg via INTRAVENOUS
  Filled 2019-02-17 (×4): qty 8

## 2019-02-17 MED ORDER — SODIUM CHLORIDE 0.9% FLUSH
3.0000 mL | Freq: Two times a day (BID) | INTRAVENOUS | Status: DC
  Administered 2019-02-17 – 2019-02-21 (×8): 3 mL via INTRAVENOUS

## 2019-02-17 MED ORDER — ENOXAPARIN SODIUM 40 MG/0.4ML ~~LOC~~ SOLN
40.0000 mg | SUBCUTANEOUS | Status: DC
  Filled 2019-02-17 (×4): qty 0.4

## 2019-02-17 MED ORDER — ACETAMINOPHEN 650 MG RE SUPP: 650.0000 mg | Freq: Four times a day (QID) | RECTAL | Status: DC | PRN

## 2019-02-17 MED ORDER — PANTOPRAZOLE SODIUM 40 MG PO TBEC
40.0000 mg | DELAYED_RELEASE_TABLET | Freq: Every day | ORAL | Status: DC
  Administered 2019-02-17 – 2019-02-21 (×5): 40 mg via ORAL
  Filled 2019-02-17 (×5): qty 1

## 2019-02-17 MED ORDER — ACETAMINOPHEN 325 MG PO TABS
650.0000 mg | ORAL_TABLET | Freq: Four times a day (QID) | ORAL | Status: DC | PRN
  Filled 2019-02-17: qty 2

## 2019-02-17 MED ORDER — SODIUM CHLORIDE 0.9 % IV SOLN
1000.0000 mg | Freq: Once | INTRAVENOUS | Status: AC
  Administered 2019-02-17: 1000 mg via INTRAVENOUS
  Filled 2019-02-17: qty 8

## 2019-02-17 NOTE — H&P (Addendum)
History and Physical    Terri Taylor ZOX:[id number] DOB: 10/22/1990 DOA: 02/17/2019  Referring MD/NP/PA:  Michela PitcherMina Fawze, PA-C PCP: Mendel Ryderrias, Jacob, PA-C  Patient coming from: Home  Chief Complaint: Left eye pain and change in vision  I [facility] personally briefly [facility]   HPI: Terri Taylor is a 28 y.o. female without significant medical problems; who presents with complaints of left eye pain and change in vision over the last 1 week.  She complains of a throbbing pain left thigh that is worsened with any movements.  Feels as though a shade or something is coming down over her visual field of the left eye.  She still can see, but reports her patient is distorted out of that eye.  She initially went to see the optometrist, but was referred to the ophthalmologist.  She went and saw the ophthalmologist 2 days ago, and and was told that it was concern for optic neuritis that is usually associated with multiple sclerosis.  She had been trying to use over-the-counter eyedrops without any relief of symptoms.  He normally does wear contact lenses and denies any significant family history of MS or any other neurologic disorder.  Denies having any focal weakness, change in speech, chest pain, fever, cough shortness of breath, nausea, vomiting, diarrhea, or dysuria.  ED Course: Upon admission into the emergency department patient's vital signs were relatively stable except for blood pressure 132/93.  Labs were unremarkable.  MRI of the brain and orbits did show findings suspicious for left optic neuritis.  Neurology was consulted and patient was given 1 g of Solu-Medrol IV.  Covid 19 pending.  Review of Systems  Constitutional: Negative for chills, malaise/fatigue and weight loss.  HENT: Negative for ear pain and nosebleeds.   Eyes: Positive for blurred vision and pain.  Respiratory: Negative for cough and shortness of breath.   Cardiovascular: Negative for chest  pain and claudication.  Gastrointestinal: Negative for diarrhea, nausea and vomiting.  Genitourinary: Negative for dysuria and frequency.  Musculoskeletal: Negative for joint pain and myalgias.  Skin: Negative for itching and rash.  Neurological: Negative for tingling and tremors.  Psychiatric/Behavioral: Negative for memory loss and substance abuse.    Past Medical History:  Diagnosis Date   Bladder pain    Eczema    Frequency of urination    Nocturia    Urgency of urination    Wears contact lenses     Past Surgical History:  Procedure Laterality Date   CYSTO WITH HYDRODISTENSION N/A 10/21/2014   Procedure: CYSTOSCOPY/HYDRODISTENSION MARCAINE AND PYRIDIUM ;  Surgeon: Bjorn PippinJohn Wrenn, MD;  [facility] [place];  Service: Urology;  Laterality: N/A;     reports that she has never smoked. She has never used smokeless tobacco. She reports that she does not drink alcohol or use drugs.  No Known Allergies  No significant family history of optic neuritis or neurologic disorder to her knowledge.  Prior to Admission medications   Medication Sig Start Date End Date Taking? Authorizing Provider  sulfacetamide (BLEPH-10) 10 % ophthalmic solution Place 1-2 drops into both eyes 4 (four) times daily. 07/28/16   Geoffery Lyonselo, Douglas, MD    Physical Exam:  Constitutional: Young female who appears to be in NAD, calm, comfortable Vitals:   02/17/19 1036 02/17/19 1137  BP: (!) 132/93   Pulse: 90   Resp: 18   Temp:  98.7 F (37.1 C)  TempSrc:  Oral  SpO2: 100%  Eyes: Discomfort noted with left eye movements.  Otherwise pupils are equal and reactive to light. ENMT: Mucous membranes are moist. Posterior pharynx clear of any exudate or lesions.Normal dentition.  Neck: normal, supple, no masses, no thyromegaly Respiratory: clear to auscultation bilaterally, no wheezing, no crackles. Normal respiratory effort. No accessory muscle use.  Cardiovascular: Regular rate and rhythm, no  murmurs / rubs / gallops. No extremity edema. 2+ pedal pulses. No carotid bruits.  Abdomen: no tenderness, no masses palpated. No hepatosplenomegaly. Bowel sounds positive.  Musculoskeletal: no clubbing / cyanosis. No joint deformity upper and lower extremities. Good ROM, no contractures. Normal muscle tone.  Skin: no rashes, lesions, ulcers. No induration Neurologic: CN 2-12 grossly intact. Sensation intact, DTR normal. Strength 5/5 in all 4.  Psychiatric: Normal judgment and insight. Alert and oriented x 3. Normal mood.     Labs on Admission: I have personally reviewed following labs and imaging studies  CBC: Recent Labs  Lab 02/17/19 1157  WBC 4.5  NEUTROABS 1.8  HGB 13.9  HCT 39.3  MCV 90.1  PLT 285   Basic Metabolic Panel: Recent Labs  Lab 02/17/19 1157  NA 138  K 3.7  CL 106  CO2 23  GLUCOSE 96  BUN 10  CREATININE 0.64  CALCIUM 9.7   GFR: CrCl cannot be calculated (Unknown ideal weight.). Liver Function Tests: No results for input(s): AST, ALT, ALKPHOS, BILITOT, PROT, ALBUMIN in the last 168 hours. No results for input(s): LIPASE, AMYLASE in the last 168 hours. No results for input(s): AMMONIA in the last 168 hours. Coagulation Profile: No results for input(s): INR, PROTIME in the last 168 hours. Cardiac Enzymes: No results for input(s): CKTOTAL, CKMB, CKMBINDEX, TROPONINI in the last 168 hours. BNP (last 3 results) No results for input(s): PROBNP in the last 8760 hours. HbA1C: No results for input(s): HGBA1C in the last 72 hours. CBG: No results for input(s): GLUCAP in the last 168 hours. Lipid Profile: No results for input(s): CHOL, HDL, LDLCALC, TRIG, CHOLHDL, LDLDIRECT in the last 72 hours. Thyroid Function Tests: No results for input(s): TSH, T4TOTAL, FREET4, T3FREE, THYROIDAB in the last 72 hours. Anemia Panel: No results for input(s): VITAMINB12, FOLATE, FERRITIN, TIBC, IRON, RETICCTPCT in the last 72 hours. Urine analysis:    Component Value  Date/Time   COLORURINE YELLOW 12/02/2011 2335   APPEARANCEUR CLEAR 12/02/2011 2335   LABSPEC 1.010 12/02/2011 2335   PHURINE 7.5 12/02/2011 2335   GLUCOSEU NEGATIVE 12/02/2011 2335   HGBUR TRACE (A) 12/02/2011 2335   BILIRUBINUR NEGATIVE 12/02/2011 2335   KETONESUR NEGATIVE 12/02/2011 2335   PROTEINUR NEGATIVE 12/02/2011 2335   UROBILINOGEN 2.0 (H) 12/02/2011 2335   NITRITE NEGATIVE 12/02/2011 2335   LEUKOCYTESUR NEGATIVE 12/02/2011 2335   Sepsis Labs: No results found for this or any previous visit (from the past 240 hour(s)).   Radiological Exams on Admission: Mr Laqueta Jean And Wo Contrast  Result Date: 02/17/2019 CLINICAL DATA:  Optic neuritis.  Blurred vision for 1 week. EXAM: MRI HEAD AND ORBITS WITHOUT AND WITH CONTRAST TECHNIQUE: Multiplanar, multiecho pulse sequences of the brain and surrounding structures were obtained without and with intravenous contrast. Multiplanar, multiecho pulse sequences of the orbits and surrounding structures were obtained including fat saturation techniques, before and after intravenous contrast administration. CONTRAST:  62mL GADAVIST GADOBUTROL 1 MMOL/ML IV SOLN COMPARISON:  None. FINDINGS: MRI HEAD FINDINGS Brain: There is no evidence of acute infarct, intracranial hemorrhage, mass, midline shift, or extra-axial fluid collection. The ventricles and sulci are normal.  The brain is normal in signal. No abnormal enhancement is identified. Vascular: Major intracranial vascular flow voids are preserved. Skull and upper cervical spine: Unremarkable bone marrow signal. Other: None. MRI ORBITS FINDINGS Orbits: Mild motion artifact and incomplete fat suppression primarily in the left orbit limit assessment. However, there is still the suggestion of true abnormal T2 hyperintensity and enhancement associated with the intraorbital and canalicular segments of the left optic nerve with the left optic nerve also appearing mildly enlarged. The right optic nerve is normal in  appearance. No orbital mass is identified. The globes appear intact. The extraocular muscles and lacrimal glands are symmetric and normal in appearance. Visualized sinuses: Minimal mucosal thickening in the paranasal sinuses. Clear mastoid air cells. Soft tissues: Unremarkable. Limited intracranial: Unremarkable appearance of the optic chiasm and pituitary gland. IMPRESSION: 1. Findings suspicious for left optic neuritis. 2. Unremarkable appearance of the brain. Electronically Signed   By: Logan Bores M.D.   On: 02/17/2019 14:00   Mr Orbits W Wo Contrast  Result Date: 02/17/2019 CLINICAL DATA:  Optic neuritis.  Blurred vision for 1 week. EXAM: MRI HEAD AND ORBITS WITHOUT AND WITH CONTRAST TECHNIQUE: Multiplanar, multiecho pulse sequences of the brain and surrounding structures were obtained without and with intravenous contrast. Multiplanar, multiecho pulse sequences of the orbits and surrounding structures were obtained including fat saturation techniques, before and after intravenous contrast administration. CONTRAST:  49mL GADAVIST GADOBUTROL 1 MMOL/ML IV SOLN COMPARISON:  None. FINDINGS: MRI HEAD FINDINGS Brain: There is no evidence of acute infarct, intracranial hemorrhage, mass, midline shift, or extra-axial fluid collection. The ventricles and sulci are normal. The brain is normal in signal. No abnormal enhancement is identified. Vascular: Major intracranial vascular flow voids are preserved. Skull and upper cervical spine: Unremarkable bone marrow signal. Other: None. MRI ORBITS FINDINGS Orbits: Mild motion artifact and incomplete fat suppression primarily in the left orbit limit assessment. However, there is still the suggestion of true abnormal T2 hyperintensity and enhancement associated with the intraorbital and canalicular segments of the left optic nerve with the left optic nerve also appearing mildly enlarged. The right optic nerve is normal in appearance. No orbital mass is identified. The globes  appear intact. The extraocular muscles and lacrimal glands are symmetric and normal in appearance. Visualized sinuses: Minimal mucosal thickening in the paranasal sinuses. Clear mastoid air cells. Soft tissues: Unremarkable. Limited intracranial: Unremarkable appearance of the optic chiasm and pituitary gland. IMPRESSION: 1. Findings suspicious for left optic neuritis. 2. Unremarkable appearance of the brain. Electronically Signed   By: Logan Bores M.D.   On: 02/17/2019 14:00      Assessment/Plan Left optic neuritis: Acute.  Patient presents with left eye pain and changes in vision.  MRI significant for findings suspicious of left optic neuritis.  Neurology consulted recommended high-dose IV steroids. -Admit to a MedSurg bed -IV Solu-Medrol 1 g daily -Appreciate neurology consultative services, will follow-up for further recommendations  Elevated diastolic blood pressure: On admission diastolic blood pressures noted to be in the 90s. -Continue to monitor  GERD prophylaxis: Protonix DVT prophylaxis: Lovenox Code Status: Full Family Communication: Discussed plan of care with the patient and sister who is present at bedside Disposition Plan: Likely discharge home in 4 to 5 days Consults called: Neurology Admission status: Inpatient  Norval Morton MD [facility] Pager [phone]   If 7PM-7AM, please contact night-coverage [email or web address] Password [email or web address]  02/17/2019, 3:30 PM

## 2019-02-17 NOTE — ED Triage Notes (Signed)
Patient sent from optometrist for 1 week for blurred vision, was sent for MRI to rule out neuritis.  Alert and oriented, NAD

## 2019-02-17 NOTE — ED Provider Notes (Signed)
[facility] Provider Note   CSN: [id number] Arrival date & time: 02/17/19  [id number]     History   Chief Complaint No chief complaint on file.   HPI Terri Taylor is a 28 y.o. female with history of eczema presents sent from ophthalmologist for evaluation of progressively worsening left eye pain and vision changes for 1 week.  She reports feeling as though there is a darkness over her left superior visual field.  Notes throbbing pain to the eye which worsens with eye movements.  Also notes left-sided headache.  She was seen and evaluated by her ophthalmologist Dr. Sherryll BurgerShah on 02/15/2019 in the office who noted new onset unilateral disc edema, left afferent pupillary defect, visual field loss and [facility] for MRI brain and orbit with and without contrast to assess for optic neuritis associated with multiple sclerosis.  She has tried over-the-counter eyedrops without relief of her symptoms.  She is a contact lens wearer.  She has no family history of MS or other neurologic disorder.  Denies fever, chest pain, shortness of breath, abdominal pain, nausea, vomiting, or diarrhea.     The history is provided by the patient.    Past Medical History:  Diagnosis Date   Bladder pain    Eczema    Frequency of urination    Nocturia    Urgency of urination    Wears contact lenses     Patient Active Problem List   Diagnosis Date Noted   Left optic neuritis 02/17/2019    Past Surgical History:  Procedure Laterality Date   CYSTO WITH HYDRODISTENSION N/A 10/21/2014   Procedure: CYSTOSCOPY/HYDRODISTENSION MARCAINE AND PYRIDIUM ;  Surgeon: Bjorn PippinJohn Wrenn, MD;  [facility] [place];  Service: Urology;  Laterality: N/A;     OB History   No obstetric history on file.      Home Medications    Prior to Admission medications   Medication Sig Start Date End Date Taking? Authorizing Provider    acetaminophen (TYLENOL) 500 MG tablet Take 500 mg by mouth every 6 (six) hours as needed for mild pain or moderate pain.   Yes [provider]  betamethasone dipropionate (DIPROLENE) 0.05 % cream Apply 1-2 application topically daily. Use for 14 days 01/09/19  Yes [provider]  diphenhydrAMINE (BENADRYL) 25 MG tablet Take 12.5 mg by mouth every 6 (six) hours as needed for sleep.   Yes [provider]  ibuprofen (ADVIL) 200 MG tablet Take 600 mg by mouth every 6 (six) hours as needed for mild pain or moderate pain.   Yes [provider]  Melatonin 1 MG/ML LIQD Take 1 mg by mouth at bedtime as needed (sleep).   Yes [provider]  Multiple Vitamins-Minerals (MULTIVITAMIN ADULT PO) Take 1 tablet by mouth daily.   Yes [provider]  solifenacin (VESICARE) 10 MG tablet Take 10 mg by mouth daily. 01/25/19  Yes [provider]  sulfacetamide (BLEPH-10) 10 % ophthalmic solution Place 1-2 drops into both eyes 4 (four) times daily. Patient not taking: Reported on 02/17/2019 07/28/16   Geoffery Lyonselo, Douglas, MD    Family History No family history on file.  Social History Social History   Tobacco Use   Smoking status: Never Smoker   Smokeless tobacco: Never Used  Substance Use Topics   Alcohol use: No   Drug use: No     Allergies   Compazine [prochlorperazine]   Review of Systems Review  of Systems  Constitutional: Negative for chills and fever.  Eyes: Positive for pain and visual disturbance. Negative for photophobia.  Respiratory: Negative for shortness of breath.   Cardiovascular: Negative for chest pain.  Gastrointestinal: Negative for abdominal pain, nausea and vomiting.  Neurological: Positive for headaches. Negative for syncope, weakness and numbness.  All other systems reviewed and are negative.    Physical Exam Updated Vital Signs BP 126/89 (BP Location: Right Arm)    Pulse 85    Temp 98.7 F (37.1 C) (Oral)     Resp 16    Ht 5\' 5"  (1.651 m)    Wt 59 kg    SpO2 100%    BMI 21.63 kg/m   Physical Exam Vitals signs and nursing note reviewed.  Constitutional:      General: She is not in acute distress.    Appearance: She is well-developed.  HENT:     Head: Normocephalic and atraumatic.  Eyes:     General:        Right eye: No discharge.        Left eye: No discharge.     Extraocular Movements: Extraocular movements intact.     Conjunctiva/sclera: Conjunctivae normal.     Comments: Left afferent pupillary defect.  No chemosis, proptosis, or consensual photophobia.  Neck:     Vascular: No JVD.     Trachea: No tracheal deviation.  Cardiovascular:     Rate and Rhythm: Normal rate and regular rhythm.  Pulmonary:     Effort: Pulmonary effort is normal.     Breath sounds: Normal breath sounds.  Abdominal:     General: Abdomen is flat. There is no distension.     Palpations: Abdomen is soft.     Tenderness: There is no abdominal tenderness. There is no guarding.  Musculoskeletal: Normal range of motion.  Skin:    General: Skin is warm and dry.     Findings: No erythema.  Neurological:     Mental Status: She is alert.     Comments: Mental Status:  Alert, thought content appropriate, able to give a coherent history. Speech fluent without evidence of aphasia. Able to follow 2 step commands without difficulty.  Cranial Nerves:  II:  Peripheral visual fields grossly normal, pupils equal, round, reactive to light III,IV, VI: ptosis not present, extra-ocular motions intact bilaterally  V,VII: smile symmetric, facial light touch sensation equal VIII: hearing grossly normal to voice  X: uvula elevates symmetrically  XI: bilateral shoulder shrug symmetric and strong XII: midline tongue extension without fassiculations Motor:  Normal tone.  Moves all extremities spontaneously without difficulty.   Psychiatric:        Behavior: Behavior normal.      ED Treatments / Results  Labs (all labs  ordered are listed, but only abnormal results are displayed) Labs Reviewed  SARS CORONAVIRUS 2 (TAT 6-24 HRS)  BASIC METABOLIC PANEL  CBC WITH DIFFERENTIAL/PLATELET  SEDIMENTATION RATE  C-REACTIVE PROTEIN  HIV ANTIBODY (ROUTINE TESTING W REFLEX)  I-STAT BETA HCG BLOOD, ED ([facility], [facility], [facility] ONLY)    EKG None  Radiology Mr Jeri Cos And Wo Contrast  Result Date: 02/17/2019 CLINICAL DATA:  Optic neuritis.  Blurred vision for 1 week. EXAM: MRI HEAD AND ORBITS WITHOUT AND WITH CONTRAST TECHNIQUE: Multiplanar, multiecho pulse sequences of the brain and surrounding structures were obtained without and with intravenous contrast. Multiplanar, multiecho pulse sequences of the orbits and surrounding structures were obtained including fat saturation techniques, before and after intravenous contrast  administration. CONTRAST:  5mL GADAVIST GADOBUTROL 1 MMOL/ML IV SOLN COMPARISON:  None. FINDINGS: MRI HEAD FINDINGS Brain: There is no evidence of acute infarct, intracranial hemorrhage, mass, midline shift, or extra-axial fluid collection. The ventricles and sulci are normal. The brain is normal in signal. No abnormal enhancement is identified. Vascular: Major intracranial vascular flow voids are preserved. Skull and upper cervical spine: Unremarkable bone marrow signal. Other: None. MRI ORBITS FINDINGS Orbits: Mild motion artifact and incomplete fat suppression primarily in the left orbit limit assessment. However, there is still the suggestion of true abnormal T2 hyperintensity and enhancement associated with the intraorbital and canalicular segments of the left optic nerve with the left optic nerve also appearing mildly enlarged. The right optic nerve is normal in appearance. No orbital mass is identified. The globes appear intact. The extraocular muscles and lacrimal glands are symmetric and normal in appearance. Visualized sinuses: Minimal mucosal thickening in the paranasal sinuses. Clear mastoid air cells. Soft  tissues: Unremarkable. Limited intracranial: Unremarkable appearance of the optic chiasm and pituitary gland. IMPRESSION: 1. Findings suspicious for left optic neuritis. 2. Unremarkable appearance of the brain. Electronically Signed   By: Sebastian AcheAllen  Grady M.D.   On: 02/17/2019 14:00   Mr Rockwell GermanyOrbits W ZOWo Contrast  Result Date: 02/17/2019 CLINICAL DATA:  Optic neuritis.  Blurred vision for 1 week. EXAM: MRI HEAD AND ORBITS WITHOUT AND WITH CONTRAST TECHNIQUE: Multiplanar, multiecho pulse sequences of the brain and surrounding structures were obtained without and with intravenous contrast. Multiplanar, multiecho pulse sequences of the orbits and surrounding structures were obtained including fat saturation techniques, before and after intravenous contrast administration. CONTRAST:  5mL GADAVIST GADOBUTROL 1 MMOL/ML IV SOLN COMPARISON:  None. FINDINGS: MRI HEAD FINDINGS Brain: There is no evidence of acute infarct, intracranial hemorrhage, mass, midline shift, or extra-axial fluid collection. The ventricles and sulci are normal. The brain is normal in signal. No abnormal enhancement is identified. Vascular: Major intracranial vascular flow voids are preserved. Skull and upper cervical spine: Unremarkable bone marrow signal. Other: None. MRI ORBITS FINDINGS Orbits: Mild motion artifact and incomplete fat suppression primarily in the left orbit limit assessment. However, there is still the suggestion of true abnormal T2 hyperintensity and enhancement associated with the intraorbital and canalicular segments of the left optic nerve with the left optic nerve also appearing mildly enlarged. The right optic nerve is normal in appearance. No orbital mass is identified. The globes appear intact. The extraocular muscles and lacrimal glands are symmetric and normal in appearance. Visualized sinuses: Minimal mucosal thickening in the paranasal sinuses. Clear mastoid air cells. Soft tissues: Unremarkable. Limited intracranial:  Unremarkable appearance of the optic chiasm and pituitary gland. IMPRESSION: 1. Findings suspicious for left optic neuritis. 2. Unremarkable appearance of the brain. Electronically Signed   By: Sebastian AcheAllen  Grady M.D.   On: 02/17/2019 14:00    Procedures Procedures (including critical care time)  Medications Ordered in ED Medications  enoxaparin (LOVENOX) injection 40 mg (has no administration in time range)  sodium chloride flush (NS) 0.9 % injection 3 mL (3 mLs Intravenous Not Given 02/17/19 1649)  ondansetron (ZOFRAN) tablet 4 mg (has no administration in time range)    Or  ondansetron (ZOFRAN) injection 4 mg (has no administration in time range)  acetaminophen (TYLENOL) tablet 650 mg (has no administration in time range)    Or  acetaminophen (TYLENOL) suppository 650 mg (has no administration in time range)  methylPREDNISolone sodium succinate (SOLU-MEDROL) 1,000 mg in sodium chloride 0.9 % 50 mL IVPB (has  no administration in time range)  pantoprazole (PROTONIX) EC tablet 40 mg (40 mg Oral Given 02/17/19 1731)  hydrALAZINE (APRESOLINE) injection 10 mg (has no administration in time range)  gadobutrol (GADAVIST) 1 MMOL/ML injection 5 mL (5 mLs Intravenous Contrast Given 02/17/19 1318)  methylPREDNISolone sodium succinate (SOLU-MEDROL) 1,000 mg in sodium chloride 0.9 % 50 mL IVPB (0 mg Intravenous Stopped 02/17/19 1629)     Initial Impression / Assessment and Plan / ED Course  I have reviewed the triage vital signs and the nursing notes.  Pertinent labs & imaging results that were available during my care of the patient were reviewed by me and considered in my medical decision making (see chart for details).        Patient sent from ophthalmologist for evaluation of left eye pain and vision changes.  She is afebrile, vital signs are stable.  She is nontoxic in appearance.  Lab work reviewed by me shows no leukocytosis, no anemia, no metabolic derangements, no renal insufficiency.  MRI shows  findings concerning for left optic neuritis with unremarkable appearance of the brain.  CONSULT:  Spoke with Dr. Laurence Slate with neurology who recommends IV Solu-Medrol 1000 mg and admission to the hospitalist service.  Spoke with Dr. Katrinka Blazing with tried hospitalist service who agrees to send care patient and bring her into the hospital for further evaluation and management.  Final Clinical Impressions(s) / ED Diagnoses   Final diagnoses:  Optic neuritis    ED Discharge Orders    None       Bennye Alm 02/17/19 1905    Sabas Sous, MD 02/22/19 1539

## 2019-02-17 NOTE — ED Notes (Signed)
Dinner tray ordered.

## 2019-02-17 NOTE — Consult Note (Addendum)
NEURO HOSPITALIST CONSULT NOTE   Requestig physician: Dr. Tamala Julian   Reason for Consult:optic neuritis   History obtained from:  Patient   HPI:                                                                                                                                          Terri Taylor is an 28 y.o. female with no significant medical history presented to [facility] today from ophthalmologist for c/o 1 week history of blurred vision.   Per patient about 1 week ago she noticed that her left eye was blurry. She states that her vision is dark almost like a sheer curtain over her eye but she can still see. She went to an optometrist who sent her to opthalmology who then sent her to [facility] for an MRI. She was here yesterday but left d.t wait time, but came back today. Last night she had a HA on the left side that also caused ear pain which is why she made sure to return today.  She does endorse HA with her cycle and said that her HA could have been related to that. HA was relieved with tylenol and ibuprofen. Denies any CP, SOB, weakness, dizziness or being  light headed.   MRI brain and Orbits:  Left optic neuritis  unremarkable brain MRI  Past Medical History:  Diagnosis Date  . Bladder pain   . Eczema   . Frequency of urination   . Nocturia   . Urgency of urination   . Wears contact lenses     Past Surgical History:  Procedure Laterality Date  . CYSTO WITH HYDRODISTENSION N/A 10/21/2014   Procedure: CYSTOSCOPY/HYDRODISTENSION MARCAINE AND PYRIDIUM ;  Surgeon: [name], MD;  Location: [facility];  Service: Urology;  Laterality: N/A;    No family history on file.           Social History:  reports that she has never smoked. She has never used smokeless tobacco. She reports that she does not drink alcohol or use drugs.  Allergies  Allergen Reactions  . Compazine [Prochlorperazine] Other (See Comments)    Pt states she was given this through her IV  during an ER visit back in 2015 for a migraine and it caused restlessness and couldn't breath. States she had to come back and was given benadryl.      MEDICATIONS:  Current Facility-Administered Medications  Medication Dose Route Frequency Provider Last Rate Last Dose  . acetaminophen (TYLENOL) tablet 650 mg  650 mg Oral Q6H PRN Clydie Braun, MD       Or  . acetaminophen (TYLENOL) suppository 650 mg  650 mg Rectal Q6H PRN Katrinka Blazing, Rondell A, MD      . enoxaparin (LOVENOX) injection 40 mg  40 mg Subcutaneous Q24H Smith, Rondell A, MD      . Melene Muller ON 02/18/2019] methylPREDNISolone sodium succinate (SOLU-MEDROL) 1,000 mg in sodium chloride 0.9 % 50 mL IVPB  1,000 mg Intravenous Daily Smith, Rondell A, MD      . ondansetron (ZOFRAN) tablet 4 mg  4 mg Oral Q6H PRN Madelyn Flavors A, MD       Or  . ondansetron (ZOFRAN) injection 4 mg  4 mg Intravenous Q6H PRN Smith, Rondell A, MD      . pantoprazole (PROTONIX) EC tablet 40 mg  40 mg Oral Daily Valentina Lucks N, NP      . sodium chloride flush (NS) 0.9 % injection 3 mL  3 mL Intravenous Q12H Clydie Braun, MD       Current Outpatient Medications  Medication Sig Dispense Refill  . acetaminophen (TYLENOL) 500 MG tablet Take 500 mg by mouth every 6 (six) hours as needed for mild pain or moderate pain.    Marland Kitchen betamethasone dipropionate (DIPROLENE) 0.05 % cream Apply 1-2 application topically daily. Use for 14 days    . diphenhydrAMINE (BENADRYL) 25 MG tablet Take 12.5 mg by mouth every 6 (six) hours as needed for sleep.    Marland Kitchen ibuprofen (ADVIL) 200 MG tablet Take 600 mg by mouth every 6 (six) hours as needed for mild pain or moderate pain.    . Melatonin 1 MG/ML LIQD Take 1 mg by mouth at bedtime as needed (sleep).    . Multiple Vitamins-Minerals (MULTIVITAMIN ADULT PO) Take 1 tablet by mouth daily.    . solifenacin (VESICARE) 10 MG  tablet Take 10 mg by mouth daily.    Marland Kitchen sulfacetamide (BLEPH-10) 10 % ophthalmic solution Place 1-2 drops into both eyes 4 (four) times daily. (Patient not taking: Reported on 02/17/2019) 5 mL 0     ROS:                                                                                                                                       ROS was performed and is negative except as noted in HPI  Blood pressure (!) 132/93, pulse 90, temperature 98.7 F (37.1 C), temperature source Oral, resp. rate 18, SpO2 100 %.   General Examination:  Physical Exam  HEENT-  Normocephalic, no lesions, without obvious abnormality.  Normal external eye and conjunctiva.   Cardiovascular- pulses palpable throughout   Lungs, no excessive working breathing.  Saturations within normal limits Extremities- Warm, dry and intact Musculoskeletal-no joint tenderness, deformity or swelling Skin-warm and dry, no hyperpigmentation, vitiligo, or suspicious lesions  Neurological Examination Mental Status: Alert, oriented, thought content appropriate.  Speech fluent without evidence of aphasia.  Able to follow commands without difficulty. Cranial Nerves: II: Visual fields grossly normal,  III,IV, VI: ptosis not present, extra-ocular motions intact bilaterally 7mm bilaterally pupils equal, afferent pupillary defect present, unable to see snellen chart with  Left eye, was able to differentiate between red, white, green and pink with left eye  round, reactive to light and accommodation V,VII: smile symmetric, facial light touch sensation normal bilaterally VIII: hearing normal bilaterally IX,X: uvula rises symmetrically XI: bilateral shoulder shrug XII: midline tongue extension Motor: Right : Upper extremity   5/5  Left:     Upper extremity   5/5  Lower extremity   5/5   Lower extremity   5/5 Tone and bulk:normal tone  throughout; no atrophy noted Sensory:  light touch intact throughout, bilaterally Deep Tendon Reflexes: 2+ and symmetric biceps, 3+ over right patella with crossed adductor Cerebellar: normal finger-to-nose, normal rapid alternating movements and normal heel-to-shin test Gait: deferred   Lab Results: Basic Metabolic Panel: Recent Labs  Lab 02/17/19 1157  NA 138  K 3.7  CL 106  CO2 23  GLUCOSE 96  BUN 10  CREATININE 0.64  CALCIUM 9.7    CBC: Recent Labs  Lab 02/17/19 1157  WBC 4.5  NEUTROABS 1.8  HGB 13.9  HCT 39.3  MCV 90.1  PLT 285    Imaging: Mr Laqueta JeanBrain W And Wo Contrast  Result Date: 02/17/2019 CLINICAL DATA:  Optic neuritis.  Blurred vision for 1 week. EXAM: MRI HEAD AND ORBITS WITHOUT AND WITH CONTRAST TECHNIQUE: Multiplanar, multiecho pulse sequences of the brain and surrounding structures were obtained without and with intravenous contrast. Multiplanar, multiecho pulse sequences of the orbits and surrounding structures were obtained including fat saturation techniques, before and after intravenous contrast administration. CONTRAST:  5mL GADAVIST GADOBUTROL 1 MMOL/ML IV SOLN COMPARISON:  None. FINDINGS: MRI HEAD FINDINGS Brain: There is no evidence of acute infarct, intracranial hemorrhage, mass, midline shift, or extra-axial fluid collection. The ventricles and sulci are normal. The brain is normal in signal. No abnormal enhancement is identified. Vascular: Major intracranial vascular flow voids are preserved. Skull and upper cervical spine: Unremarkable bone marrow signal. Other: None. MRI ORBITS FINDINGS Orbits: Mild motion artifact and incomplete fat suppression primarily in the left orbit limit assessment. However, there is still the suggestion of true abnormal T2 hyperintensity and enhancement associated with the intraorbital and canalicular segments of the left optic nerve with the left optic nerve also appearing mildly enlarged. The right optic nerve is normal in  appearance. No orbital mass is identified. The globes appear intact. The extraocular muscles and lacrimal glands are symmetric and normal in appearance. Visualized sinuses: Minimal mucosal thickening in the paranasal sinuses. Clear mastoid air cells. Soft tissues: Unremarkable. Limited intracranial: Unremarkable appearance of the optic chiasm and pituitary gland. IMPRESSION: 1. Findings suspicious for left optic neuritis. 2. Unremarkable appearance of the brain. Electronically Signed   By: Sebastian AcheAllen  Grady M.D.   On: 02/17/2019 14:00   Mr Rockwell GermanyOrbits W ZOWo Contrast  Result Date: 02/17/2019 CLINICAL DATA:  Optic neuritis.  Blurred vision for 1 week.  EXAM: MRI HEAD AND ORBITS WITHOUT AND WITH CONTRAST TECHNIQUE: Multiplanar, multiecho pulse sequences of the brain and surrounding structures were obtained without and with intravenous contrast. Multiplanar, multiecho pulse sequences of the orbits and surrounding structures were obtained including fat saturation techniques, before and after intravenous contrast administration. CONTRAST:  5mL GADAVIST GADOBUTROL 1 MMOL/ML IV SOLN COMPARISON:  None. FINDINGS: MRI HEAD FINDINGS Brain: There is no evidence of acute infarct, intracranial hemorrhage, mass, midline shift, or extra-axial fluid collection. The ventricles and sulci are normal. The brain is normal in signal. No abnormal enhancement is identified. Vascular: Major intracranial vascular flow voids are preserved. Skull and upper cervical spine: Unremarkable bone marrow signal. Other: None. MRI ORBITS FINDINGS Orbits: Mild motion artifact and incomplete fat suppression primarily in the left orbit limit assessment. However, there is still the suggestion of true abnormal T2 hyperintensity and enhancement associated with the intraorbital and canalicular segments of the left optic nerve with the left optic nerve also appearing mildly enlarged. The right optic nerve is normal in appearance. No orbital mass is identified. The globes  appear intact. The extraocular muscles and lacrimal glands are symmetric and normal in appearance. Visualized sinuses: Minimal mucosal thickening in the paranasal sinuses. Clear mastoid air cells. Soft tissues: Unremarkable. Limited intracranial: Unremarkable appearance of the optic chiasm and pituitary gland. IMPRESSION: 1. Findings suspicious for left optic neuritis. 2. Unremarkable appearance of the brain. Electronically Signed   By: Sebastian AcheAllen  Grady M.D.   On: 02/17/2019 14:00    Assessment:  Arta BruceDesiree Taylor is an 28 y.o. female with no significant medical history [facility] for c/o 1 week history of blurred vision.MRI: left eye optic neuritis. Will treat with 5 days of solumedrol.   Left eye optic neuritis   Recommendations: --MRI brain and orbits ( completed) -1 g solumedrol x 5 days -ppi  Valentina LucksJessica Williams, MSN, NP-C [facility] [phone]  Attending neurologist's note to follow   02/17/2019, 4:25 PM   NEUROHOSPITALIST ADDENDUM Performed a face to face diagnostic evaluation.   I have reviewed the contents of history and physical exam as documented by PA/ARNP/Resident and agree with above documentation.  I have discussed and formulated the above plan as documented. Edits to the note have been made as needed.  28 year old African-American female presenting with left eye pain had blurry vision and to 1 week.  Patient went to see Dr. Sherryll BurgerShah on 9/18 and sent to emergency room to get a MRI brain with and without contrast to evaluate for optic neuritis and rule out multiple sclerosis.  Discussed with patient, first time having this symptom.  Denies any prior symptoms of numbness tingling in any of her extremities, weakness, gait imbalance, urinary incontinence or urgency. MRI brain shows contrast-enhancement of the left optic nerve and no other changes to suggest MS.  On examination, patient has mild RAPD.  Fundus:  unable to view through  undilated pupil. No appreciate superior quadrantanopsia on my exam. On testing reflexes patient has brisk right patellar reflex with cross adductor.  We will obtain MRI C-spine and T-spine without contrast.   Right eye optic neuritis -IV Solu-Medrol 1 g into 3 to 5 days -MRI C and T-spine    Jood Retana MD [facility] [phone]   If 7pm to 7am, please call on call as listed on AMION.

## 2019-02-17 NOTE — ED Notes (Signed)
Report attempted to receiving floor.  

## 2019-02-18 ENCOUNTER — Inpatient Hospital Stay (HOSPITAL_COMMUNITY): Payer: BLUE CROSS/BLUE SHIELD

## 2019-02-18 LAB — SARS CORONAVIRUS 2 (TAT 6-24 HRS): SARS Coronavirus 2: NEGATIVE

## 2019-02-18 LAB — HIV ANTIBODY (ROUTINE TESTING W REFLEX): HIV Screen 4th Generation wRfx: NONREACTIVE

## 2019-02-18 IMAGING — MR MR THORACIC SPINE W/O CM
5 of 6 series · 23 of 48 positions shown · non-contrast
Comparison: Previous MRI of the brain and orbits from [DATE].

CLINICAL DATA: Initial evaluation for multiple sclerosis, new
neurologic event. Brisk right patellar reflex with cross adductor.

EXAM:
MRI CERVICAL AND THORACIC SPINE WITHOUT CONTRAST
TECHNIQUE: Multiplanar and multiecho pulse sequences of the cervical spine, to
include the craniocervical junction and cervicothoracic junction,
and the thoracic spine, were obtained without intravenous contrast.

[Series 14: T1 · sagittal · 6.0mm · 1.23mm/px · 2 of 9 slices shown (1 of 2)]
[im 1/9]
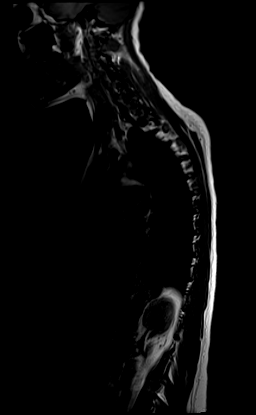
[im 9/9]
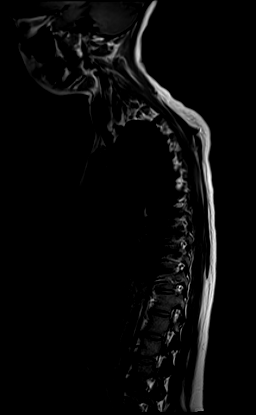

[Series 15: T2 · sagittal · 3.0mm · 0.76mm/px · 6 of 17 slices shown (1 of 2)]
[im 1/17]
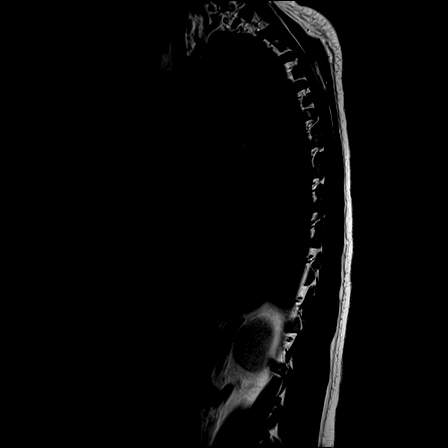
[im 4/17]
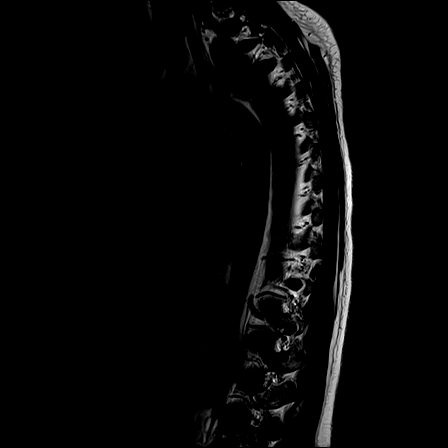
[im 7/17]
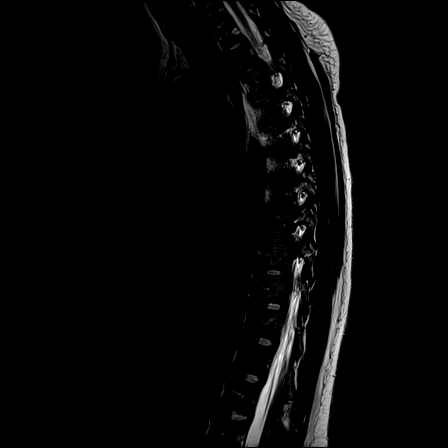
[im 10/17]
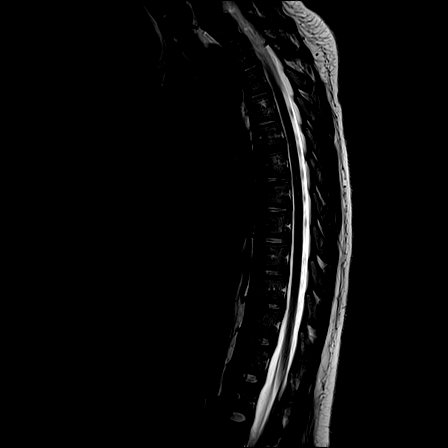
[im 13/17]
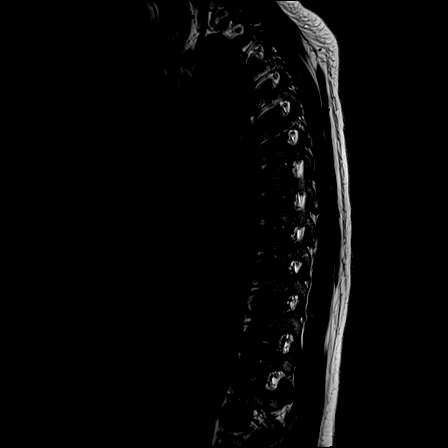
[im 17/17]
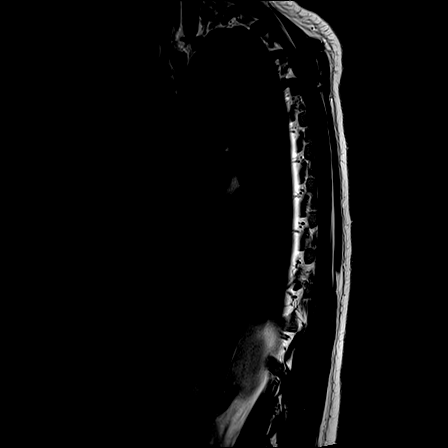

[Series 16: T1 · sagittal · 3.0mm · 0.76mm/px · 6 of 17 slices shown (2 of 2)]
[im 1/17]
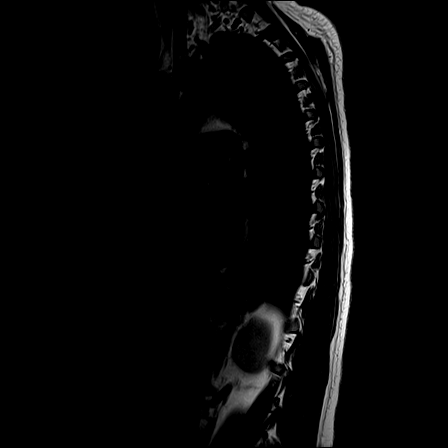
[im 4/17]
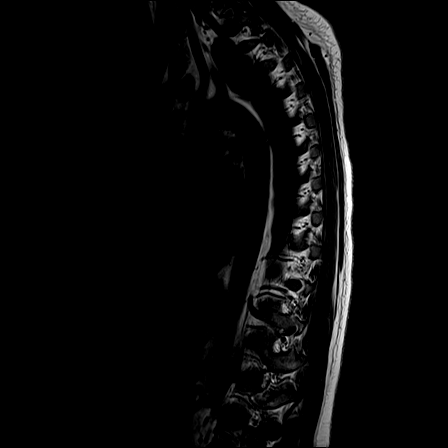
[im 7/17]
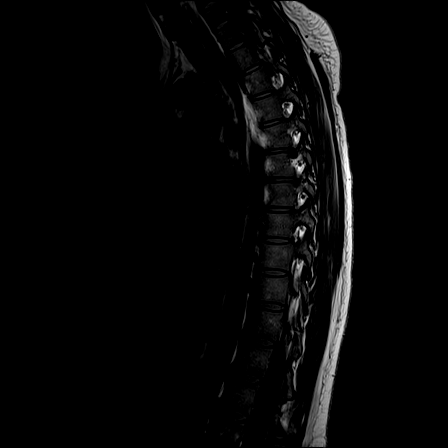
[im 10/17]
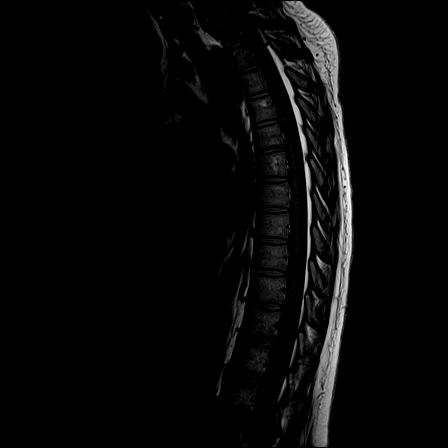
[im 13/17]
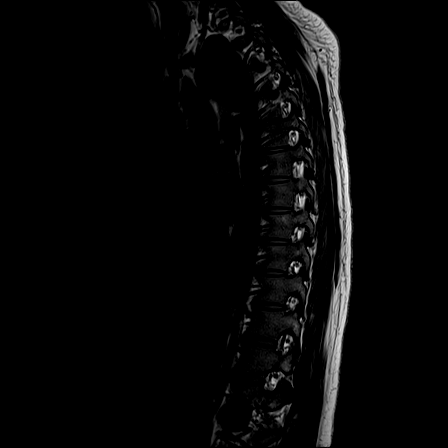
[im 17/17]
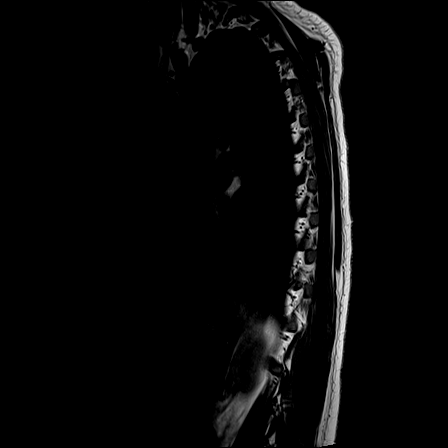

[Series 17: STIR · sagittal · 3.0mm · 0.38mm/px · 1 of 17 slices shown]
[im 1/17]
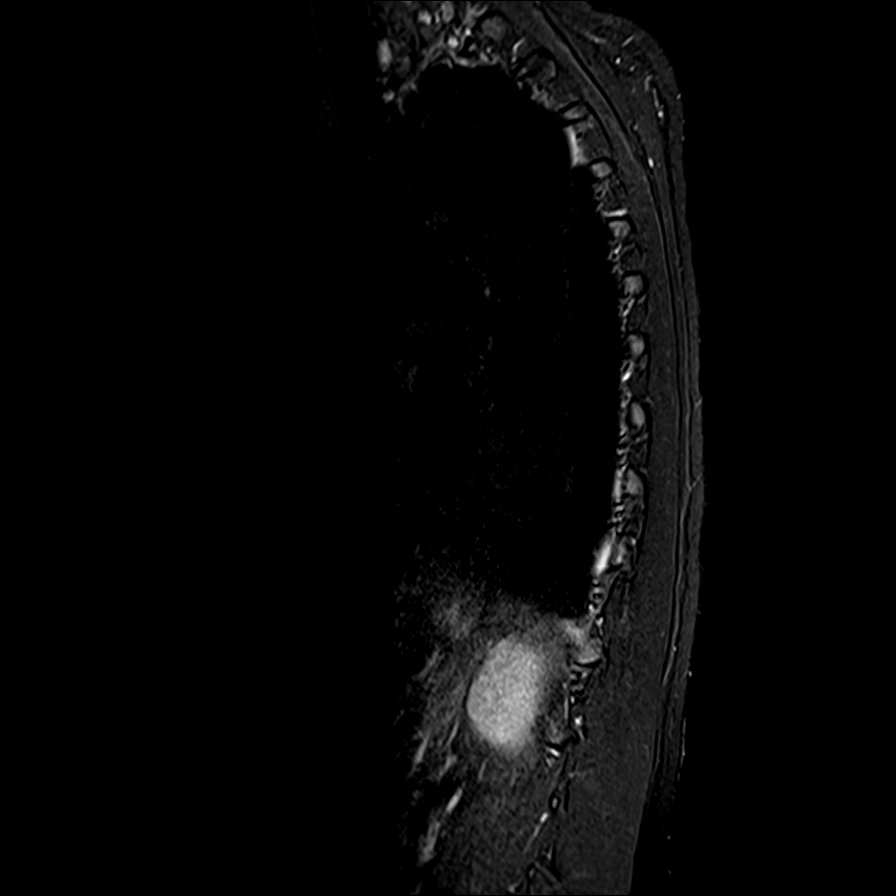

[Series 18: T2 · axial · 4.0mm · 0.59mm/px · z∈[-325,-118]mm · 8 of 39 slices shown (2 of 2)]
[im 1/39]
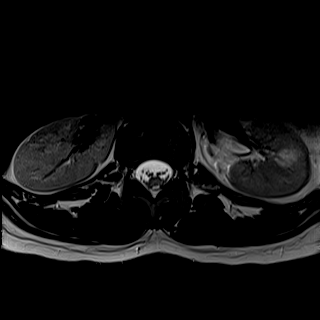
[im 6/39]
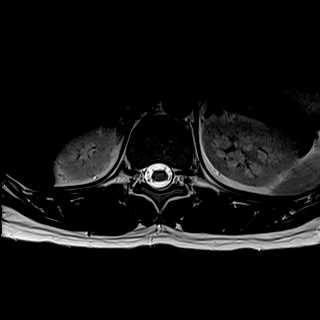
[im 12/39]
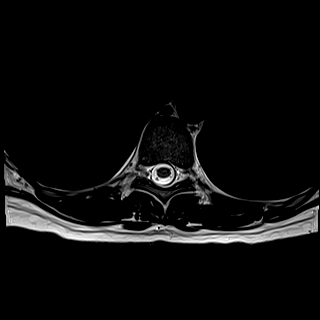
[im 18/39]
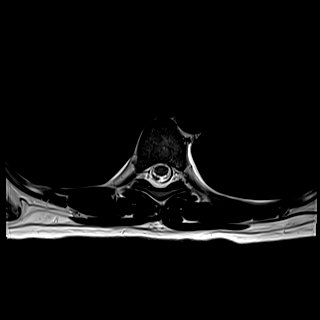
[im 21/39]
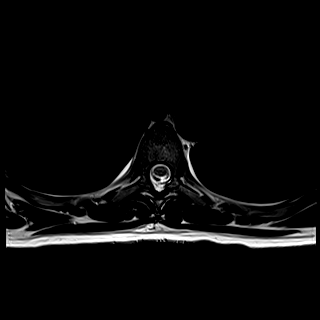
[im 27/39]
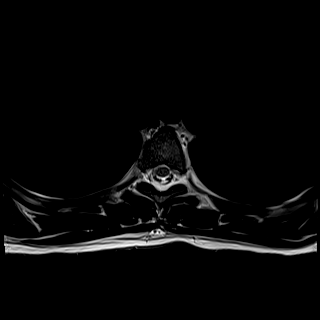
[im 33/39]
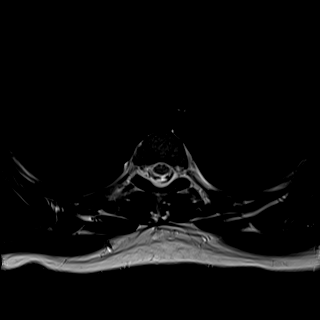
[im 39/39]
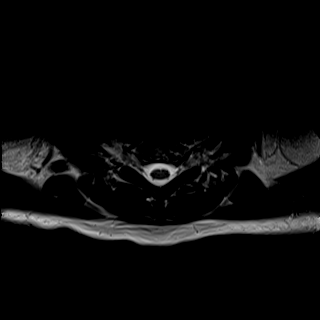

[23 of 48 positions shown; findings below may reference images not displayed]

FINDINGS: MRI CERVICAL SPINE FINDINGS

Alignment: Mild levoscoliosis with straightening of the normal
cervical lordosis. No listhesis or subluxation.

Vertebrae: Vertebral body height maintained without evidence for
acute or chronic fracture. Bone marrow signal intensity within
normal limits. No discrete or worrisome osseous lesions. No abnormal
marrow edema.

Cord: Signal intensity within the cervical spinal cord is normal. No
cord signal abnormality to suggest demyelinating disease. Normal
cord caliber and morphology.

Posterior Fossa, vertebral arteries, paraspinal tissues: Visualized
brain and posterior fossa within normal limits. Craniocervical
junction normal. Paraspinous and prevertebral soft tissues within
normal limits. Normal intravascular flow voids seen within the
vertebral arteries bilaterally.

Disc levels:

No significant disc pathology seen within the cervical spine. No
significant facet degeneration. No canal or neural foraminal
stenosis. No impingement.

MRI THORACIC SPINE FINDINGS

Alignment: Vertebral bodies normally aligned with preservation of
the normal thoracic kyphosis. No listhesis or subluxation.

Vertebrae: Vertebral body height maintained without evidence for
acute or chronic fracture. Bone marrow signal intensity within
normal limits. No discrete or worrisome osseous lesions. No abnormal
marrow edema.

Cord: Signal intensity within the thoracic spinal cord is normal. No
cord signal abnormality to suggest demyelinating disease. Normal
cord caliber morphology. Conus medullaris terminates at the L1
level.

Paraspinal and other soft tissues: Paraspinous soft tissues within
normal limits. Visualized lungs are clear. Visualized visceral
structures unremarkable.

Disc levels:

T5-6: Small right paracentral disc protrusion contacts the right
ventral spinal cord (series 19, image 16). Minimal flattening of the
right ventral cord without significant stenosis.

No other significant disc pathology seen within the thoracic spine.
No other stenosis or impingement.
IMPRESSION: 1. Normal MRI appearance of the cervical spinal cord. No evidence
for demyelinating disease or other abnormality.
2. Normal MRI appearance of the thoracic spinal cord. No evidence
for demyelinating disease.
3. Right paracentral disc protrusion at T5-6 with minimal flattening
of the right ventral hemicord. No significant stenosis.

## 2019-02-18 IMAGING — MR MR CERVICAL SPINE W/O CM
5 series · 35 of 48 positions shown · non-contrast
Comparison: Previous MRI of the brain and orbits from [DATE].

CLINICAL DATA: Initial evaluation for multiple sclerosis, new
neurologic event. Brisk right patellar reflex with cross adductor.

EXAM:
MRI CERVICAL AND THORACIC SPINE WITHOUT CONTRAST
TECHNIQUE: Multiplanar and multiecho pulse sequences of the cervical spine, to
include the craniocervical junction and cervicothoracic junction,
and the thoracic spine, were obtained without intravenous contrast.

[Series 5: T2 · sagittal · 3.0mm · 0.69mm/px · 6 of 15 slices shown (1 of 2)]
[im 1/15]
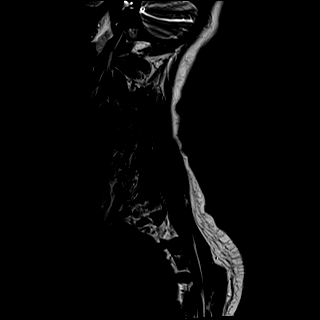
[im 3/15]
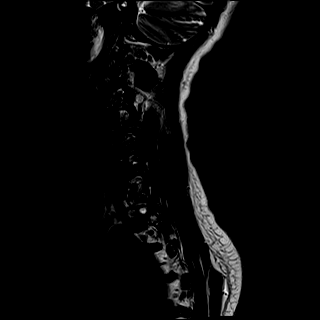
[im 6/15]
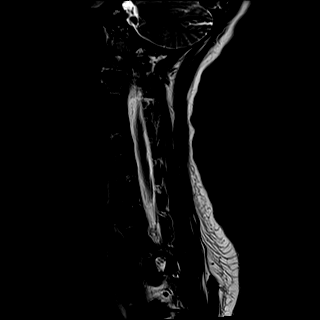
[im 9/15]
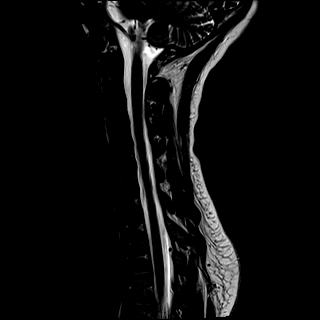
[im 12/15]
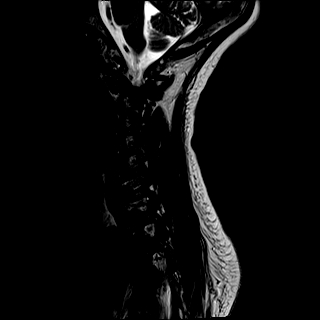
[im 15/15]
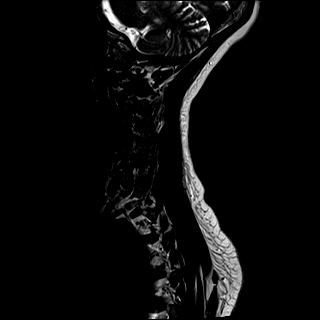

[Series 6: T1 · sagittal · 3.0mm · 0.69mm/px · 6 of 15 slices shown]
[im 1/15]
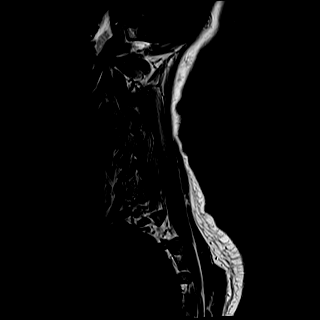
[im 3/15]
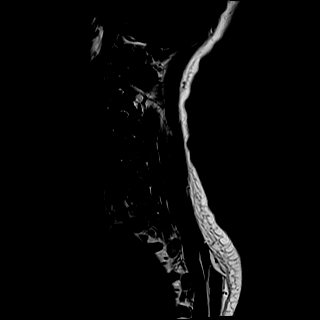
[im 6/15]
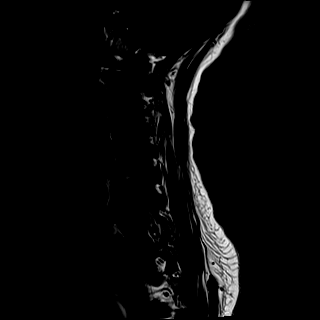
[im 9/15]
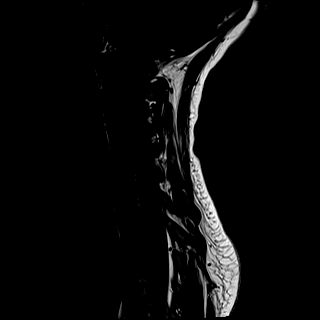
[im 12/15]
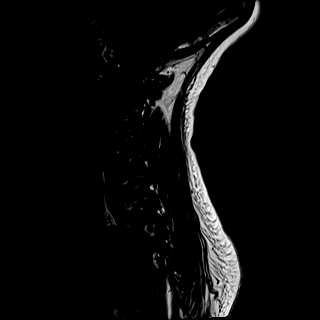
[im 15/15]
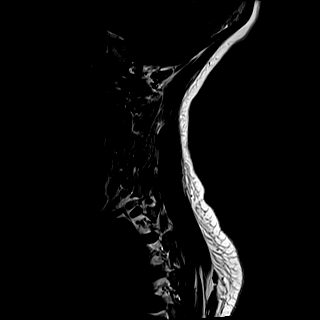

[Series 7: STIR · sagittal · 3.0mm · 0.86mm/px · 6 of 15 slices shown]
[im 1/15]
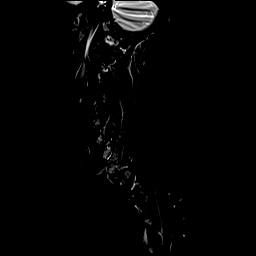
[im 3/15]
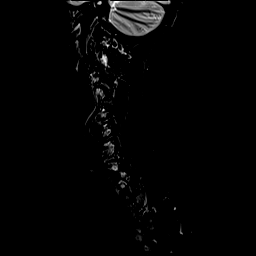
[im 6/15]
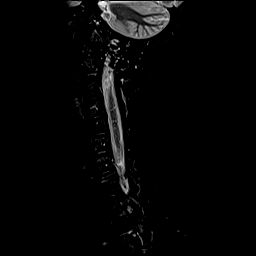
[im 9/15]
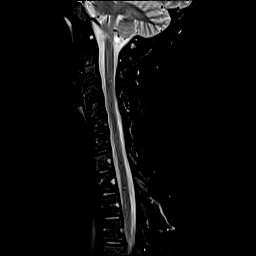
[im 12/15]
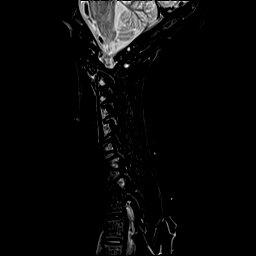
[im 15/15]
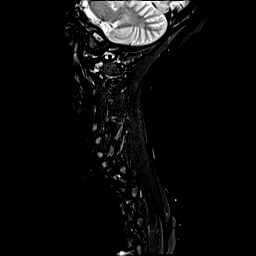

[Series 8: T2 · axial · 3.0mm · 0.66mm/px · z∈[-153,-44]mm · 9 of 40 slices shown (2 of 2)]
[im 1/40]
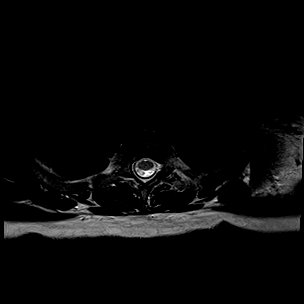
[im 6/40]
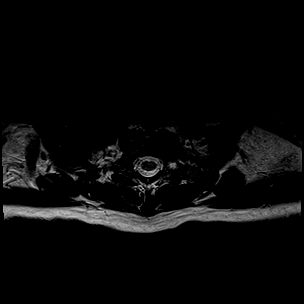
[im 12/40]
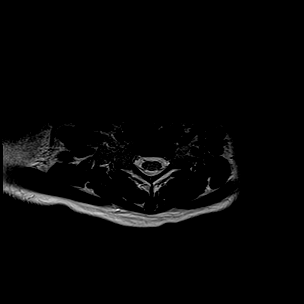
[im 17/40]
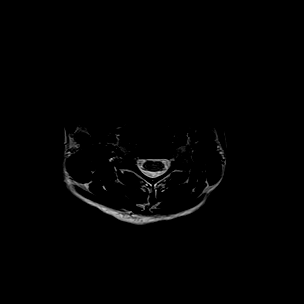
[im 20/40]
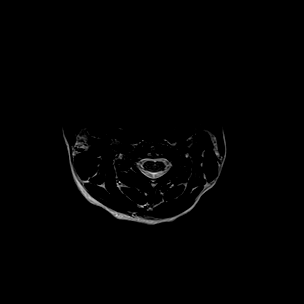
[im 23/40]
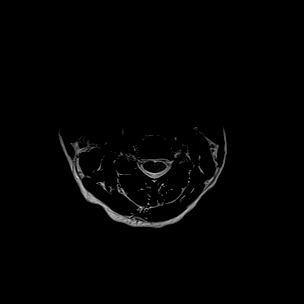
[im 28/40]
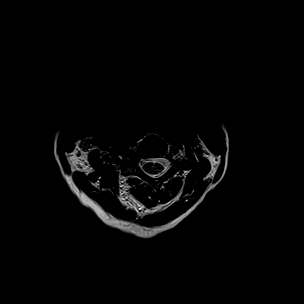
[im 34/40]
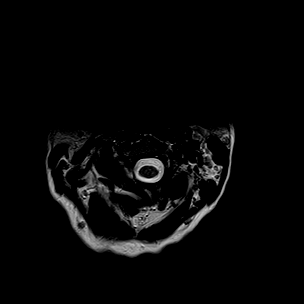
[im 40/40]
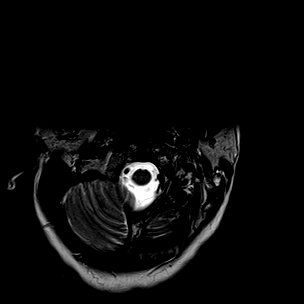

[Series 9: GRE · axial · 3.0mm · 0.39mm/px · z∈[-153,-44]mm · 8 of 40 slices shown]
[im 1/40]
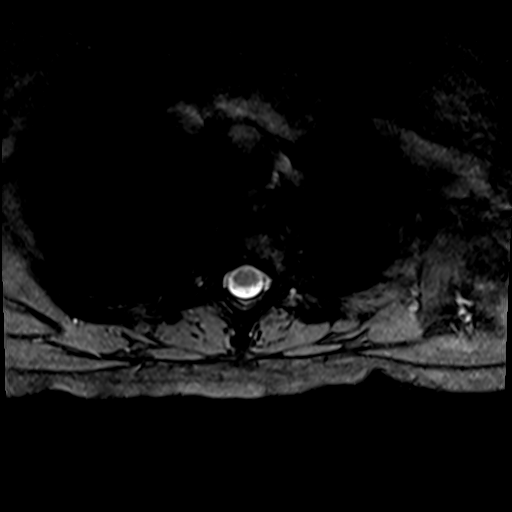
[im 6/40]
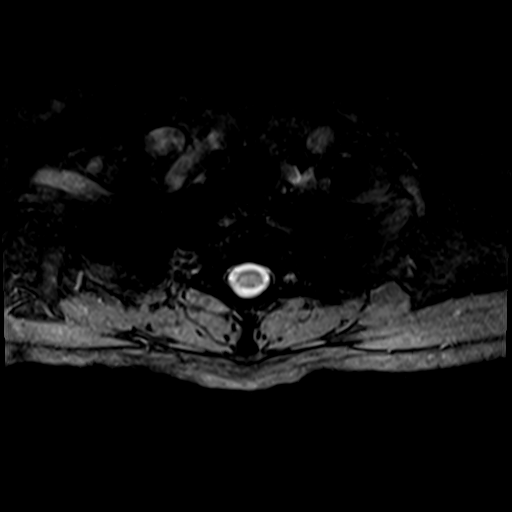
[im 12/40]
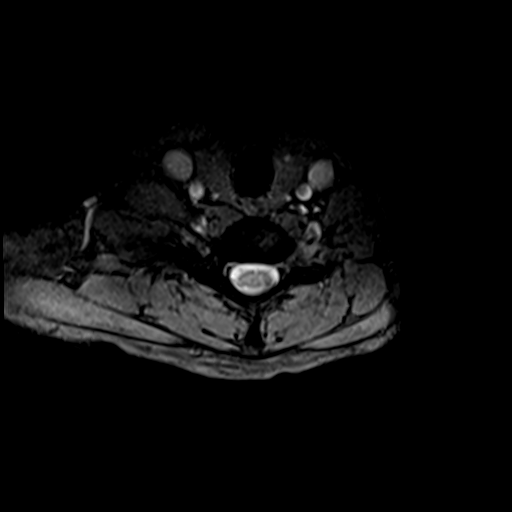
[im 17/40]
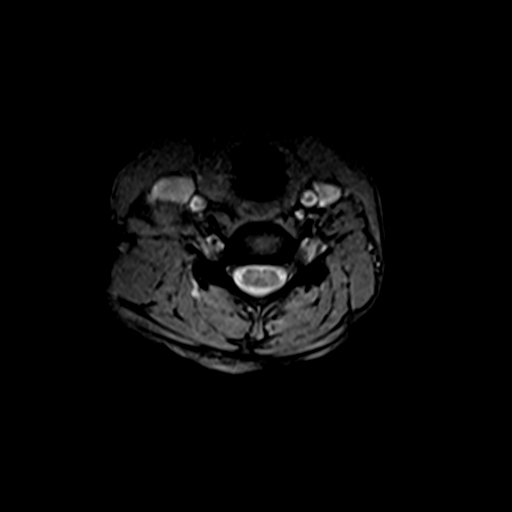
[im 23/40]
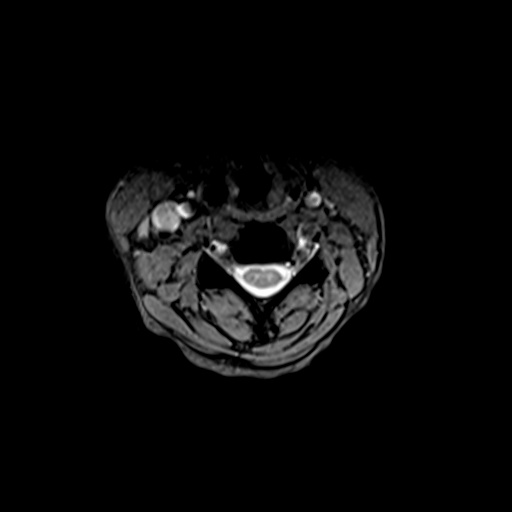
[im 28/40]
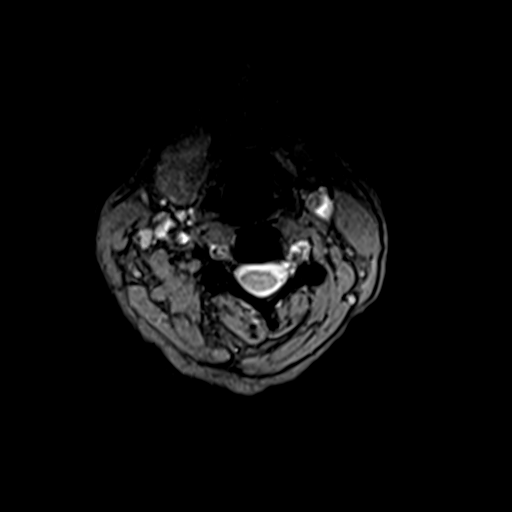
[im 34/40]
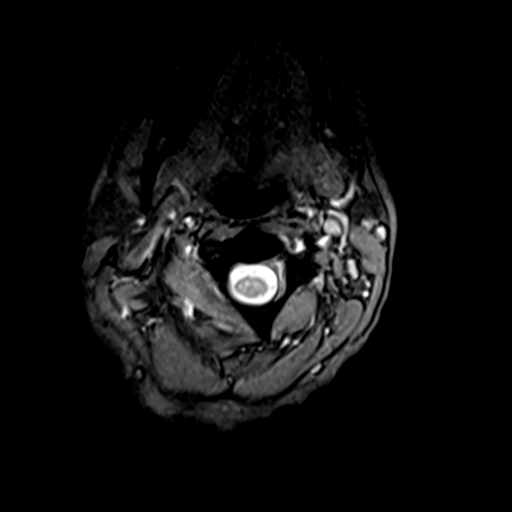
[im 40/40]
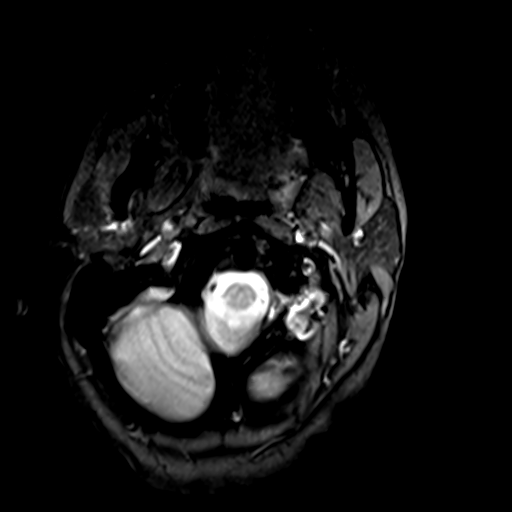

[35 of 48 positions shown; findings below may reference images not displayed]

FINDINGS: MRI CERVICAL SPINE FINDINGS

Alignment: Mild levoscoliosis with straightening of the normal
cervical lordosis. No listhesis or subluxation.

Vertebrae: Vertebral body height maintained without evidence for
acute or chronic fracture. Bone marrow signal intensity within
normal limits. No discrete or worrisome osseous lesions. No abnormal
marrow edema.

Cord: Signal intensity within the cervical spinal cord is normal. No
cord signal abnormality to suggest demyelinating disease. Normal
cord caliber and morphology.

Posterior Fossa, vertebral arteries, paraspinal tissues: Visualized
brain and posterior fossa within normal limits. Craniocervical
junction normal. Paraspinous and prevertebral soft tissues within
normal limits. Normal intravascular flow voids seen within the
vertebral arteries bilaterally.

Disc levels:

No significant disc pathology seen within the cervical spine. No
significant facet degeneration. No canal or neural foraminal
stenosis. No impingement.

MRI THORACIC SPINE FINDINGS

Alignment: Vertebral bodies normally aligned with preservation of
the normal thoracic kyphosis. No listhesis or subluxation.

Vertebrae: Vertebral body height maintained without evidence for
acute or chronic fracture. Bone marrow signal intensity within
normal limits. No discrete or worrisome osseous lesions. No abnormal
marrow edema.

Cord: Signal intensity within the thoracic spinal cord is normal. No
cord signal abnormality to suggest demyelinating disease. Normal
cord caliber morphology. Conus medullaris terminates at the L1
level.

Paraspinal and other soft tissues: Paraspinous soft tissues within
normal limits. Visualized lungs are clear. Visualized visceral
structures unremarkable.

Disc levels:

T5-6: Small right paracentral disc protrusion contacts the right
ventral spinal cord (series 19, image 16). Minimal flattening of the
right ventral cord without significant stenosis.

No other significant disc pathology seen within the thoracic spine.
No other stenosis or impingement.
IMPRESSION: 1. Normal MRI appearance of the cervical spinal cord. No evidence
for demyelinating disease or other abnormality.
2. Normal MRI appearance of the thoracic spinal cord. No evidence
for demyelinating disease.
3. Right paracentral disc protrusion at T5-6 with minimal flattening
of the right ventral hemicord. No significant stenosis.

## 2019-02-18 NOTE — Progress Notes (Signed)
PROGRESS NOTE    Terri Taylor  ONG:[id number] DOB: 1990/11/17 DOA: 02/17/2019 PCP: Mendel Ryder, PA-C   Brief Narrative: As per H&P:28 y.o. female without significant medical problems; who presents with complaints of left eye pain and change in vision over the last 1 week.  She complains of a throbbing pain left thigh that is worsened with any movements.  Feels as though a shade or something is coming down over her visual field of the left eye.  She still can see, but reports her patient is distorted out of that eye.  She initially went to see the optometrist, but was referred to the ophthalmologist.  She went and saw the ophthalmologist 2 days ago, and and was told that it was concern for optic neuritis that is usually associated with multiple sclerosis.  She had been trying to use over-the-counter eyedrops without any relief of symptoms.  He normally does wear contact lenses and denies any significant family history of MS or any other neurologic disorder.  Denies having any focal weakness, change in speech, chest pain, fever, cough shortness of breath, nausea, vomiting, diarrhea, or dysuria.  In ER vital signs were relatively stable except for blood pressure 132/93.  Labs were unremarkable.  MRI of the brain and orbits did show findings suspicious for left optic neuritis.  Neurology was consulted and patient was given 1 g of Solu-Medrol IV.  Covid 19 negative.  Densities negative.  Subjective: Resting comfortably she feels her VISION ON LEFT EYE is improving, and has less pain.  Assessment & Plan:   Acute left optic neuritis: Appreciate neurology input.  Patient is to continue on IV Solu-Medrol 1 g daily x5 doses.  Today is day 2.  Symptoms seems to be improving.  Continue Protonix while on high-dose steroid.  Elevated blood pressure on admission: Currently stable without need for meds.  Body mass index is 21.63 kg/m.   DVT prophylaxis:lovenox Code Status: Code Family Communication: plan  of care discussed with patient in detail.   Disposition Plan: Remains inpatient pending clinical improvement and completion of high-dose IV steroids x5 days  Consultants: Neurology  Procedures: None  Microbiology: 19-  Antimicrobials: Anti-infectives (From admission, onward)   None       Objective: Vitals:   02/17/19 2022 02/17/19 2052 02/18/19 0703 02/18/19 1258  BP: 110/77 (!) 147/90 118/68 110/71  Pulse: 84 100 99 84  Resp: 16 17 19 19   Temp:  98.7 F (37.1 C) 97.9 F (36.6 C) 98.5 F (36.9 C)  TempSrc:  Oral  Oral  SpO2: 98% 100% 100% 100%  Weight:      Height:        Intake/Output Summary (Last 24 hours) at 02/18/2019 1605 Last data filed at 02/18/2019 1302 Gross per 24 hour  Intake 548 ml  Output --  Net 548 ml   Filed Weights   02/17/19 1733  Weight: 59 kg   Weight change:   Body mass index is 21.63 kg/m.  Intake/Output from previous day: 09/20 0701 - 09/21 0700 In: 58 [IV Piggyback:58] Out: -  Intake/Output this shift: Total I/O In: 490 [P.O.:490] Out: -   Examination:  General exam: Appears calm and comfortable,Not in distress, older fore the age HEENT:PERRL,Oral mucosa moist, Ear/Nose normal on gross exam Respiratory system: Bilateral equal air entry, normal vesicular breath sounds, no wheezes or crackles  Cardiovascular system: S1 & S2 heard,No JVD, murmurs. Gastrointestinal system: Abdomen is  soft, non tender, non distended, BS +  Nervous System:Alert and  oriented. No focal neurological deficits/moving extremities, sensation intact. Extremities: No edema, no clubbing, distal peripheral pulses palpable. Skin: No rashes, lesions, no icterus MSK: Normal muscle bulk,tone ,power  Medications:  Scheduled Meds:  enoxaparin (LOVENOX) injection  40 mg Subcutaneous Q24H   pantoprazole  40 mg Oral Daily   sodium chloride flush  3 mL Intravenous Q12H   Continuous Infusions:  methylPREDNISolone (SOLU-MEDROL) injection 1,000 mg (02/18/19  0947)    Data Reviewed: I have personally reviewed following labs and imaging studies  CBC: Recent Labs  Lab 02/17/19 1157  WBC 4.5  NEUTROABS 1.8  HGB 13.9  HCT 39.3  MCV 90.1  PLT 694   Basic Metabolic Panel: Recent Labs  Lab 02/17/19 1157  NA 138  K 3.7  CL 106  CO2 23  GLUCOSE 96  BUN 10  CREATININE 0.64  CALCIUM 9.7   GFR: Estimated Creatinine Clearance: 94.2 mL/min (by C-G formula based on SCr of 0.64 mg/dL). Liver Function Tests: No results for input(s): AST, ALT, ALKPHOS, BILITOT, PROT, ALBUMIN in the last 168 hours. No results for input(s): LIPASE, AMYLASE in the last 168 hours. No results for input(s): AMMONIA in the last 168 hours. Coagulation Profile: No results for input(s): INR, PROTIME in the last 168 hours. Cardiac Enzymes: No results for input(s): CKTOTAL, CKMB, CKMBINDEX, TROPONINI in the last 168 hours. BNP (last 3 results) No results for input(s): PROBNP in the last 8760 hours. HbA1C: No results for input(s): HGBA1C in the last 72 hours. CBG: No results for input(s): GLUCAP in the last 168 hours. Lipid Profile: No results for input(s): CHOL, HDL, LDLCALC, TRIG, CHOLHDL, LDLDIRECT in the last 72 hours. Thyroid Function Tests: No results for input(s): TSH, T4TOTAL, FREET4, T3FREE, THYROIDAB in the last 72 hours. Anemia Panel: No results for input(s): VITAMINB12, FOLATE, FERRITIN, TIBC, IRON, RETICCTPCT in the last 72 hours. Sepsis Labs: No results for input(s): PROCALCITON, LATICACIDVEN in the last 168 hours.  Recent Results (from the past 240 hour(s))  SARS CORONAVIRUS 2 (TAT 6-24 HRS) Nasopharyngeal Nasopharyngeal Swab     Status: None   Collection Time: 02/17/19  3:33 PM   Specimen: Nasopharyngeal Swab  Result Value Ref Range Status   SARS Coronavirus 2 NEGATIVE NEGATIVE Final    Comment: (NOTE) SARS-CoV-2 target nucleic acids are NOT DETECTED. The SARS-CoV-2 RNA is generally detectable in upper and lower respiratory specimens during  the acute phase of infection. Negative results do not preclude SARS-CoV-2 infection, do not rule out co-infections with other pathogens, and should not be used as the sole basis for treatment or other patient management decisions. Negative results must be combined with clinical observations, patient history, and epidemiological information. The expected result is Negative. Fact Sheet for Patients: SugarRoll.be Fact Sheet for Healthcare Providers: https://www.woods-mathews.com/ This test is not yet approved or cleared by the [place] FDA and  has been authorized for detection and/or diagnosis of SARS-CoV-2 by FDA under an Emergency Use Authorization (EUA). This EUA will remain  in effect (meaning this test can be used) for the duration of the COVID-19 declaration under Section 56 4(b)(1) of the Act, 21 U.S.C. section 360bbb-3(b)(1), unless the authorization is terminated or revoked sooner. Performed at [facility], [place] [address], [place], [place] [address]       Radiology Studies: Mr Jeri Cos And Wo Contrast  Result Date: 02/17/2019 CLINICAL DATA:  Optic neuritis.  Blurred vision for 1 week. EXAM: MRI HEAD AND ORBITS WITHOUT AND WITH CONTRAST TECHNIQUE: Multiplanar, multiecho pulse  sequences of the brain and surrounding structures were obtained without and with intravenous contrast. Multiplanar, multiecho pulse sequences of the orbits and surrounding structures were obtained including fat saturation techniques, before and after intravenous contrast administration. CONTRAST:  45mL GADAVIST GADOBUTROL 1 MMOL/ML IV SOLN COMPARISON:  None. FINDINGS: MRI HEAD FINDINGS Brain: There is no evidence of acute infarct, intracranial hemorrhage, mass, midline shift, or extra-axial fluid collection. The ventricles and sulci are normal. The brain is normal in signal. No abnormal enhancement is identified. Vascular: Major intracranial vascular flow voids  are preserved. Skull and upper cervical spine: Unremarkable bone marrow signal. Other: None. MRI ORBITS FINDINGS Orbits: Mild motion artifact and incomplete fat suppression primarily in the left orbit limit assessment. However, there is still the suggestion of true abnormal T2 hyperintensity and enhancement associated with the intraorbital and canalicular segments of the left optic nerve with the left optic nerve also appearing mildly enlarged. The right optic nerve is normal in appearance. No orbital mass is identified. The globes appear intact. The extraocular muscles and lacrimal glands are symmetric and normal in appearance. Visualized sinuses: Minimal mucosal thickening in the paranasal sinuses. Clear mastoid air cells. Soft tissues: Unremarkable. Limited intracranial: Unremarkable appearance of the optic chiasm and pituitary gland. IMPRESSION: 1. Findings suspicious for left optic neuritis. 2. Unremarkable appearance of the brain. Electronically Signed   By: Sebastian Ache M.D.   On: 02/17/2019 14:00   Mr Cervical Spine Wo Contrast  Result Date: 02/18/2019 CLINICAL DATA:  Initial evaluation for multiple sclerosis, new neurologic event. Brisk right patellar reflex with cross adductor. EXAM: MRI CERVICAL AND THORACIC SPINE WITHOUT CONTRAST TECHNIQUE: Multiplanar and multiecho pulse sequences of the cervical spine, to include the craniocervical junction and cervicothoracic junction, and the thoracic spine, were obtained without intravenous contrast. COMPARISON:  Previous MRI of the brain and orbits from 02/17/2019. FINDINGS: MRI CERVICAL SPINE FINDINGS Alignment: Mild levoscoliosis with straightening of the normal cervical lordosis. No listhesis or subluxation. Vertebrae: Vertebral body height maintained without evidence for acute or chronic fracture. Bone marrow signal intensity within normal limits. No discrete or worrisome osseous lesions. No abnormal marrow edema. Cord: Signal intensity within the cervical  spinal cord is normal. No cord signal abnormality to suggest demyelinating disease. Normal cord caliber and morphology. Posterior Fossa, vertebral arteries, paraspinal tissues: Visualized brain and posterior fossa within normal limits. Craniocervical junction normal. Paraspinous and prevertebral soft tissues within normal limits. Normal intravascular flow voids seen within the vertebral arteries bilaterally. Disc levels: No significant disc pathology seen within the cervical spine. No significant facet degeneration. No canal or neural foraminal stenosis. No impingement. MRI THORACIC SPINE FINDINGS Alignment: Vertebral bodies normally aligned with preservation of the normal thoracic kyphosis. No listhesis or subluxation. Vertebrae: Vertebral body height maintained without evidence for acute or chronic fracture. Bone marrow signal intensity within normal limits. No discrete or worrisome osseous lesions. No abnormal marrow edema. Cord: Signal intensity within the thoracic spinal cord is normal. No cord signal abnormality to suggest demyelinating disease. Normal cord caliber morphology. Conus medullaris terminates at the L1 level. Paraspinal and other soft tissues: Paraspinous soft tissues within normal limits. Visualized lungs are clear. Visualized visceral structures unremarkable. Disc levels: T5-6: Small right paracentral disc protrusion contacts the right ventral spinal cord (series 19, image 16). Minimal flattening of the right ventral cord without significant stenosis. No other significant disc pathology seen within the thoracic spine. No other stenosis or impingement. IMPRESSION: 1. Normal MRI appearance of the cervical spinal cord. No evidence for  demyelinating disease or other abnormality. 2. Normal MRI appearance of the thoracic spinal cord. No evidence for demyelinating disease. 3. Right paracentral disc protrusion at T5-6 with minimal flattening of the right ventral hemicord. No significant stenosis.  Electronically Signed   By: Rise Mu M.D.   On: 02/18/2019 01:33   Mr Thoracic Spine Wo Contrast  Result Date: 02/18/2019 CLINICAL DATA:  Initial evaluation for multiple sclerosis, new neurologic event. Brisk right patellar reflex with cross adductor. EXAM: MRI CERVICAL AND THORACIC SPINE WITHOUT CONTRAST TECHNIQUE: Multiplanar and multiecho pulse sequences of the cervical spine, to include the craniocervical junction and cervicothoracic junction, and the thoracic spine, were obtained without intravenous contrast. COMPARISON:  Previous MRI of the brain and orbits from 02/17/2019. FINDINGS: MRI CERVICAL SPINE FINDINGS Alignment: Mild levoscoliosis with straightening of the normal cervical lordosis. No listhesis or subluxation. Vertebrae: Vertebral body height maintained without evidence for acute or chronic fracture. Bone marrow signal intensity within normal limits. No discrete or worrisome osseous lesions. No abnormal marrow edema. Cord: Signal intensity within the cervical spinal cord is normal. No cord signal abnormality to suggest demyelinating disease. Normal cord caliber and morphology. Posterior Fossa, vertebral arteries, paraspinal tissues: Visualized brain and posterior fossa within normal limits. Craniocervical junction normal. Paraspinous and prevertebral soft tissues within normal limits. Normal intravascular flow voids seen within the vertebral arteries bilaterally. Disc levels: No significant disc pathology seen within the cervical spine. No significant facet degeneration. No canal or neural foraminal stenosis. No impingement. MRI THORACIC SPINE FINDINGS Alignment: Vertebral bodies normally aligned with preservation of the normal thoracic kyphosis. No listhesis or subluxation. Vertebrae: Vertebral body height maintained without evidence for acute or chronic fracture. Bone marrow signal intensity within normal limits. No discrete or worrisome osseous lesions. No abnormal marrow edema.  Cord: Signal intensity within the thoracic spinal cord is normal. No cord signal abnormality to suggest demyelinating disease. Normal cord caliber morphology. Conus medullaris terminates at the L1 level. Paraspinal and other soft tissues: Paraspinous soft tissues within normal limits. Visualized lungs are clear. Visualized visceral structures unremarkable. Disc levels: T5-6: Small right paracentral disc protrusion contacts the right ventral spinal cord (series 19, image 16). Minimal flattening of the right ventral cord without significant stenosis. No other significant disc pathology seen within the thoracic spine. No other stenosis or impingement. IMPRESSION: 1. Normal MRI appearance of the cervical spinal cord. No evidence for demyelinating disease or other abnormality. 2. Normal MRI appearance of the thoracic spinal cord. No evidence for demyelinating disease. 3. Right paracentral disc protrusion at T5-6 with minimal flattening of the right ventral hemicord. No significant stenosis. Electronically Signed   By: Rise Mu M.D.   On: 02/18/2019 01:33   Mr Rockwell [place] ZO Contrast  Result Date: 02/17/2019 CLINICAL DATA:  Optic neuritis.  Blurred vision for 1 week. EXAM: MRI HEAD AND ORBITS WITHOUT AND WITH CONTRAST TECHNIQUE: Multiplanar, multiecho pulse sequences of the brain and surrounding structures were obtained without and with intravenous contrast. Multiplanar, multiecho pulse sequences of the orbits and surrounding structures were obtained including fat saturation techniques, before and after intravenous contrast administration. CONTRAST:  5mL GADAVIST GADOBUTROL 1 MMOL/ML IV SOLN COMPARISON:  None. FINDINGS: MRI HEAD FINDINGS Brain: There is no evidence of acute infarct, intracranial hemorrhage, mass, midline shift, or extra-axial fluid collection. The ventricles and sulci are normal. The brain is normal in signal. No abnormal enhancement is identified. Vascular: Major intracranial vascular flow  voids are preserved. Skull and upper cervical spine: Unremarkable bone marrow signal.  Other: None. MRI ORBITS FINDINGS Orbits: Mild motion artifact and incomplete fat suppression primarily in the left orbit limit assessment. However, there is still the suggestion of true abnormal T2 hyperintensity and enhancement associated with the intraorbital and canalicular segments of the left optic nerve with the left optic nerve also appearing mildly enlarged. The right optic nerve is normal in appearance. No orbital mass is identified. The globes appear intact. The extraocular muscles and lacrimal glands are symmetric and normal in appearance. Visualized sinuses: Minimal mucosal thickening in the paranasal sinuses. Clear mastoid air cells. Soft tissues: Unremarkable. Limited intracranial: Unremarkable appearance of the optic chiasm and pituitary gland. IMPRESSION: 1. Findings suspicious for left optic neuritis. 2. Unremarkable appearance of the brain. Electronically Signed   By: Sebastian AcheAllen  Grady M.D.   On: 02/17/2019 14:00      LOS: 1 day   Time spent: More than 50% of that time was spent in counseling and/or coordination of care.  Lanae Boastamesh Kearsten Ginther, MD [facility]  02/18/2019, 4:05 PM

## 2019-02-18 NOTE — Progress Notes (Signed)
   02/17/19 2052  Vitals  Temp 98.7 F (37.1 C)  Temp Source Oral  BP (!) 147/90  MAP (mmHg) 108  BP Location Left Arm  BP Method Automatic  Patient Position (if appropriate) Lying  Pulse Rate 100  Pulse Rate Source Monitor  Resp 17  Oxygen Therapy  SpO2 100 %  O2 Device Room Air  MEWS Score  MEWS RR 0  MEWS Pulse 0  MEWS Systolic 0  MEWS LOC 0  MEWS Temp 0  MEWS Score 0  MEWS Score Color Green    Assumed care of pt to 5W rm 24. Pt was oriented to unit and  Falls education completed. Pt able to Verbalize understanding to risks associated with falls.Skin assessment completed with Roxanne Gates.  Pt with eczema otherwise intact skin.

## 2019-02-18 NOTE — Progress Notes (Signed)
MRI of cervical and thoracic spine resulted: 1. Normal MRI appearance of the cervical spinal cord. No evidence for demyelinating disease or other abnormality. 2. Normal MRI appearance of the thoracic spinal cord. No evidence for demyelinating disease. 3. Right paracentral disc protrusion at T5-6 with minimal flattening of the right ventral hemicord. No significant stenosis.  A/R: Acute right optic neuritis 1. Continue IV Solumedrol 1000 mg qd x 5 days 2. Follow up with Dr. Manuella Ghazi 3. Follow up with [facility] or [facility] as outpatient. Although her MRI brain shows no findings c/w MS other than the left optic neuritis (enhancement on MRI), she may benefit from serial evaluations including periodic MRI scans as she is at significant risk of subsequent development of MS given her episode of optic neuritis.  4. Neurohospitalist service will sign off. Please call if there are additional questions.   Electronically signed: Dr. Kerney Elbe

## 2019-02-19 NOTE — Progress Notes (Signed)
Patient states that she took a medication called Elmiron in 2016 or 2018. Patient states that there is information about this medication causing issues with the eyes later in life, and there are multiple lawsuits against this medication. Patient wants MD to be aware of this. This RN will pass info to day shift RN. Patient encouraged to tell MD with AM rounds. Will continue to monitor.

## 2019-02-19 NOTE — Progress Notes (Signed)
PROGRESS NOTE    Terri Taylor  BZJ:[id number] DOB: 1991-03-17 DOA: 02/17/2019 PCP: Mendel Ryder, PA-C   Brief Narrative: As per H&P:28 y.o. female without significant medical problems; who presents with complaints of left eye pain and change in vision over the last 1 week.  She complains of a throbbing pain left thigh that is worsened with any movements.  Feels as though a shade or something is coming down over her visual field of the left eye.  She still can see, but reports her patient is distorted out of that eye.  She initially went to see the optometrist, but was referred to the ophthalmologist.  She went and saw the ophthalmologist 2 days ago, and and was told that it was concern for optic neuritis that is usually associated with multiple sclerosis.  She had been trying to use over-the-counter eyedrops without any relief of symptoms.  He normally does wear contact lenses and denies any significant family history of MS or any other neurologic disorder.  Denies having any focal weakness, change in speech, chest pain, fever, cough shortness of breath, nausea, vomiting, diarrhea, or dysuria.  In ER vital signs were relatively stable except for blood pressure 132/93.  Labs were unremarkable.  MRI of the brain and orbits did show findings suspicious for left optic neuritis.  Neurology was consulted and patient was given 1 g of Solu-Medrol IV.  Covid 19 negative.  Densities negative.  Subjective: Ambulating in the room.  No acute events overnight.  Still having some dark spots on her left visual field. Assessment & Plan:   Acute left optic neuritis: Appreciate neurology input.  Neurology has seen and signed off.  Plan is to continue on IV Solu-Medrol 1 g daily x5 doses. Today is day 3.  Complains of mild left eyes dark spots intermittently -symptoms seems to be improving.  Continue Protonix while on high-dose steroid.  Elevated blood pressure on admission:Currently stable without need for  meds.  Patient reports taking Elmiron in 2016 to end of 2018. Discussed with Dr Otelia Limes it is unlikely.  Body mass index is 21.63 kg/m.   DVT prophylaxis:lovenox Code Status: Code Family Communication: plan of care discussed with patient in detail.   Disposition Plan: Remains inpatient pending clinical improvement and completion of high-dose IV steroids x5 days  Consultants: Neurology  Procedures: None  Microbiology: 19-  Antimicrobials: Anti-infectives (From admission, onward)   None       Objective: Vitals:   02/18/19 1258 02/18/19 2316 02/19/19 0500 02/19/19 0800  BP: 110/71 124/80 120/78 117/87  Pulse: 84 99 95 88  Resp: 19 18 16 16   Temp: 98.5 F (36.9 C) 98 F (36.7 C) 98.4 F (36.9 C) 98.2 F (36.8 C)  TempSrc: Oral Oral Oral Oral  SpO2: 100% 100% 100% 100%  Weight:      Height:        Intake/Output Summary (Last 24 hours) at 02/19/2019 1159 Last data filed at 02/18/2019 2200 Gross per 24 hour  Intake 383 ml  Output --  Net 383 ml   Filed Weights   02/17/19 1733  Weight: 59 kg   Weight change:   Body mass index is 21.63 kg/m.  Intake/Output from previous day: 09/21 0701 - 09/22 0700 In: 613 [P.O.:610; I.V.:3] Out: -  Intake/Output this shift: No intake/output data recorded.  Examination:  General exam: Appears calm and comfortable,Not in distress, older fore the age HEENT:PERRL,Oral mucosa moist, Ear/Nose normal on gross exam none, nystagmus, Respiratory system: Bilateral equal  air entry, normal vesicular breath sounds, no wheezes or crackles  Cardiovascular system: S1 & S2 heard,No JVD, murmurs. Gastrointestinal system: Abdomen is  soft, non tender, non distended, BS +  Nervous System:Alert and oriented.  Nonfocal and ambulatory  Extremities: No edema, no clubbing, distal peripheral pulses palpable. Skin: No rashes, lesions, no icterus MSK: Normal muscle bulk,tone ,power  Medications:  Scheduled Meds:  enoxaparin (LOVENOX)  injection  40 mg Subcutaneous Q24H   pantoprazole  40 mg Oral Daily   sodium chloride flush  3 mL Intravenous Q12H   Continuous Infusions:  methylPREDNISolone (SOLU-MEDROL) injection 1,000 mg (02/19/19 1024)    Data Reviewed: I have personally reviewed following labs and imaging studies  CBC: Recent Labs  Lab 02/17/19 1157  WBC 4.5  NEUTROABS 1.8  HGB 13.9  HCT 39.3  MCV 90.1  PLT 285   Basic Metabolic Panel: Recent Labs  Lab 02/17/19 1157  NA 138  K 3.7  CL 106  CO2 23  GLUCOSE 96  BUN 10  CREATININE 0.64  CALCIUM 9.7   GFR: Estimated Creatinine Clearance: 94.2 mL/min (by C-G formula based on SCr of 0.64 mg/dL). Liver Function Tests: No results for input(s): AST, ALT, ALKPHOS, BILITOT, PROT, ALBUMIN in the last 168 hours. No results for input(s): LIPASE, AMYLASE in the last 168 hours. No results for input(s): AMMONIA in the last 168 hours. Coagulation Profile: No results for input(s): INR, PROTIME in the last 168 hours. Cardiac Enzymes: No results for input(s): CKTOTAL, CKMB, CKMBINDEX, TROPONINI in the last 168 hours. BNP (last 3 results) No results for input(s): PROBNP in the last 8760 hours. HbA1C: No results for input(s): HGBA1C in the last 72 hours. CBG: No results for input(s): GLUCAP in the last 168 hours. Lipid Profile: No results for input(s): CHOL, HDL, LDLCALC, TRIG, CHOLHDL, LDLDIRECT in the last 72 hours. Thyroid Function Tests: No results for input(s): TSH, T4TOTAL, FREET4, T3FREE, THYROIDAB in the last 72 hours. Anemia Panel: No results for input(s): VITAMINB12, FOLATE, FERRITIN, TIBC, IRON, RETICCTPCT in the last 72 hours. Sepsis Labs: No results for input(s): PROCALCITON, LATICACIDVEN in the last 168 hours.  Recent Results (from the past 240 hour(s))  SARS CORONAVIRUS 2 (TAT 6-24 HRS) Nasopharyngeal Nasopharyngeal Swab     Status: None   Collection Time: 02/17/19  3:33 PM   Specimen: Nasopharyngeal Swab  Result Value Ref Range Status    SARS Coronavirus 2 NEGATIVE NEGATIVE Final    Comment: (NOTE) SARS-CoV-2 target nucleic acids are NOT DETECTED. The SARS-CoV-2 RNA is generally detectable in upper and lower respiratory specimens during the acute phase of infection. Negative results do not preclude SARS-CoV-2 infection, do not rule out co-infections with other pathogens, and should not be used as the sole basis for treatment or other patient management decisions. Negative results must be combined with clinical observations, patient history, and epidemiological information. The expected result is Negative. Fact Sheet for Patients: HairSlick.no Fact Sheet for Healthcare Providers: quierodirigir.com This test is not yet approved or cleared by the [place] FDA and  has been authorized for detection and/or diagnosis of SARS-CoV-2 by FDA under an Emergency Use Authorization (EUA). This EUA will remain  in effect (meaning this test can be used) for the duration of the COVID-19 declaration under Section 56 4(b)(1) of the Act, 21 U.S.C. section 360bbb-3(b)(1), unless the authorization is terminated or revoked sooner. Performed at [facility], [address], [place], [place] [address]       Radiology Studies: Mr  Brain W And Wo Contrast  Result Date: 02/17/2019 CLINICAL DATA:  Optic neuritis.  Blurred vision for 1 week. EXAM: MRI HEAD AND ORBITS WITHOUT AND WITH CONTRAST TECHNIQUE: Multiplanar, multiecho pulse sequences of the brain and surrounding structures were obtained without and with intravenous contrast. Multiplanar, multiecho pulse sequences of the orbits and surrounding structures were obtained including fat saturation techniques, before and after intravenous contrast administration. CONTRAST:  47mL GADAVIST GADOBUTROL 1 MMOL/ML IV SOLN COMPARISON:  None. FINDINGS: MRI HEAD FINDINGS Brain: There is no evidence of acute infarct, intracranial hemorrhage,  mass, midline shift, or extra-axial fluid collection. The ventricles and sulci are normal. The brain is normal in signal. No abnormal enhancement is identified. Vascular: Major intracranial vascular flow voids are preserved. Skull and upper cervical spine: Unremarkable bone marrow signal. Other: None. MRI ORBITS FINDINGS Orbits: Mild motion artifact and incomplete fat suppression primarily in the left orbit limit assessment. However, there is still the suggestion of true abnormal T2 hyperintensity and enhancement associated with the intraorbital and canalicular segments of the left optic nerve with the left optic nerve also appearing mildly enlarged. The right optic nerve is normal in appearance. No orbital mass is identified. The globes appear intact. The extraocular muscles and lacrimal glands are symmetric and normal in appearance. Visualized sinuses: Minimal mucosal thickening in the paranasal sinuses. Clear mastoid air cells. Soft tissues: Unremarkable. Limited intracranial: Unremarkable appearance of the optic chiasm and pituitary gland. IMPRESSION: 1. Findings suspicious for left optic neuritis. 2. Unremarkable appearance of the brain. Electronically Signed   By: Logan Bores M.D.   On: 02/17/2019 14:00   Mr Cervical Spine Wo Contrast  Result Date: 02/18/2019 CLINICAL DATA:  Initial evaluation for multiple sclerosis, new neurologic event. Brisk right patellar reflex with cross adductor. EXAM: MRI CERVICAL AND THORACIC SPINE WITHOUT CONTRAST TECHNIQUE: Multiplanar and multiecho pulse sequences of the cervical spine, to include the craniocervical junction and cervicothoracic junction, and the thoracic spine, were obtained without intravenous contrast. COMPARISON:  Previous MRI of the brain and orbits from 02/17/2019. FINDINGS: MRI CERVICAL SPINE FINDINGS Alignment: Mild levoscoliosis with straightening of the normal cervical lordosis. No listhesis or subluxation. Vertebrae: Vertebral body height maintained  without evidence for acute or chronic fracture. Bone marrow signal intensity within normal limits. No discrete or worrisome osseous lesions. No abnormal marrow edema. Cord: Signal intensity within the cervical spinal cord is normal. No cord signal abnormality to suggest demyelinating disease. Normal cord caliber and morphology. Posterior Fossa, vertebral arteries, paraspinal tissues: Visualized brain and posterior fossa within normal limits. Craniocervical junction normal. Paraspinous and prevertebral soft tissues within normal limits. Normal intravascular flow voids seen within the vertebral arteries bilaterally. Disc levels: No significant disc pathology seen within the cervical spine. No significant facet degeneration. No canal or neural foraminal stenosis. No impingement. MRI THORACIC SPINE FINDINGS Alignment: Vertebral bodies normally aligned with preservation of the normal thoracic kyphosis. No listhesis or subluxation. Vertebrae: Vertebral body height maintained without evidence for acute or chronic fracture. Bone marrow signal intensity within normal limits. No discrete or worrisome osseous lesions. No abnormal marrow edema. Cord: Signal intensity within the thoracic spinal cord is normal. No cord signal abnormality to suggest demyelinating disease. Normal cord caliber morphology. Conus medullaris terminates at the L1 level. Paraspinal and other soft tissues: Paraspinous soft tissues within normal limits. Visualized lungs are clear. Visualized visceral structures unremarkable. Disc levels: T5-6: Small right paracentral disc protrusion contacts the right ventral spinal cord (series 19, image 16). Minimal flattening of the right  ventral cord without significant stenosis. No other significant disc pathology seen within the thoracic spine. No other stenosis or impingement. IMPRESSION: 1. Normal MRI appearance of the cervical spinal cord. No evidence for demyelinating disease or other abnormality. 2. Normal MRI  appearance of the thoracic spinal cord. No evidence for demyelinating disease. 3. Right paracentral disc protrusion at T5-6 with minimal flattening of the right ventral hemicord. No significant stenosis. Electronically Signed   By: Rise MuBenjamin  McClintock M.D.   On: 02/18/2019 01:33   Mr Thoracic Spine Wo Contrast  Result Date: 02/18/2019 CLINICAL DATA:  Initial evaluation for multiple sclerosis, new neurologic event. Brisk right patellar reflex with cross adductor. EXAM: MRI CERVICAL AND THORACIC SPINE WITHOUT CONTRAST TECHNIQUE: Multiplanar and multiecho pulse sequences of the cervical spine, to include the craniocervical junction and cervicothoracic junction, and the thoracic spine, were obtained without intravenous contrast. COMPARISON:  Previous MRI of the brain and orbits from 02/17/2019. FINDINGS: MRI CERVICAL SPINE FINDINGS Alignment: Mild levoscoliosis with straightening of the normal cervical lordosis. No listhesis or subluxation. Vertebrae: Vertebral body height maintained without evidence for acute or chronic fracture. Bone marrow signal intensity within normal limits. No discrete or worrisome osseous lesions. No abnormal marrow edema. Cord: Signal intensity within the cervical spinal cord is normal. No cord signal abnormality to suggest demyelinating disease. Normal cord caliber and morphology. Posterior Fossa, vertebral arteries, paraspinal tissues: Visualized brain and posterior fossa within normal limits. Craniocervical junction normal. Paraspinous and prevertebral soft tissues within normal limits. Normal intravascular flow voids seen within the vertebral arteries bilaterally. Disc levels: No significant disc pathology seen within the cervical spine. No significant facet degeneration. No canal or neural foraminal stenosis. No impingement. MRI THORACIC SPINE FINDINGS Alignment: Vertebral bodies normally aligned with preservation of the normal thoracic kyphosis. No listhesis or subluxation.  Vertebrae: Vertebral body height maintained without evidence for acute or chronic fracture. Bone marrow signal intensity within normal limits. No discrete or worrisome osseous lesions. No abnormal marrow edema. Cord: Signal intensity within the thoracic spinal cord is normal. No cord signal abnormality to suggest demyelinating disease. Normal cord caliber morphology. Conus medullaris terminates at the L1 level. Paraspinal and other soft tissues: Paraspinous soft tissues within normal limits. Visualized lungs are clear. Visualized visceral structures unremarkable. Disc levels: T5-6: Small right paracentral disc protrusion contacts the right ventral spinal cord (series 19, image 16). Minimal flattening of the right ventral cord without significant stenosis. No other significant disc pathology seen within the thoracic spine. No other stenosis or impingement. IMPRESSION: 1. Normal MRI appearance of the cervical spinal cord. No evidence for demyelinating disease or other abnormality. 2. Normal MRI appearance of the thoracic spinal cord. No evidence for demyelinating disease. 3. Right paracentral disc protrusion at T5-6 with minimal flattening of the right ventral hemicord. No significant stenosis. Electronically Signed   By: Rise MuBenjamin  McClintock M.D.   On: 02/18/2019 01:33   Mr Rockwell GermanyOrbits W ZOWo Contrast  Result Date: 02/17/2019 CLINICAL DATA:  Optic neuritis.  Blurred vision for 1 week. EXAM: MRI HEAD AND ORBITS WITHOUT AND WITH CONTRAST TECHNIQUE: Multiplanar, multiecho pulse sequences of the brain and surrounding structures were obtained without and with intravenous contrast. Multiplanar, multiecho pulse sequences of the orbits and surrounding structures were obtained including fat saturation techniques, before and after intravenous contrast administration. CONTRAST:  5mL GADAVIST GADOBUTROL 1 MMOL/ML IV SOLN COMPARISON:  None. FINDINGS: MRI HEAD FINDINGS Brain: There is no evidence of acute infarct, intracranial  hemorrhage, mass, midline shift, or extra-axial fluid collection. The  ventricles and sulci are normal. The brain is normal in signal. No abnormal enhancement is identified. Vascular: Major intracranial vascular flow voids are preserved. Skull and upper cervical spine: Unremarkable bone marrow signal. Other: None. MRI ORBITS FINDINGS Orbits: Mild motion artifact and incomplete fat suppression primarily in the left orbit limit assessment. However, there is still the suggestion of true abnormal T2 hyperintensity and enhancement associated with the intraorbital and canalicular segments of the left optic nerve with the left optic nerve also appearing mildly enlarged. The right optic nerve is normal in appearance. No orbital mass is identified. The globes appear intact. The extraocular muscles and lacrimal glands are symmetric and normal in appearance. Visualized sinuses: Minimal mucosal thickening in the paranasal sinuses. Clear mastoid air cells. Soft tissues: Unremarkable. Limited intracranial: Unremarkable appearance of the optic chiasm and pituitary gland. IMPRESSION: 1. Findings suspicious for left optic neuritis. 2. Unremarkable appearance of the brain. Electronically Signed   By: Sebastian Ache M.D.   On: 02/17/2019 14:00      LOS: 2 days   Time spent: More than 50% of that time was spent in counseling and/or coordination of care.  Lanae Boast, MD [facility]  02/19/2019, 11:59 AM

## 2019-02-19 NOTE — Progress Notes (Signed)
Information given on optic neuritis from webmd site

## 2019-02-20 NOTE — Progress Notes (Signed)
PROGRESS NOTE    Terri Taylor  ENI:[id number] DOB: [birth year] DOA: 02/17/2019 PCP: Forrestine Him, PA-C   Brief Narrative:  Patient is a 28 year old female with no significant past medical history who presents with left eye pain, blurry vision on the left eye.  She was seen by ophthalmologist as an outpatient and there was concern for optic neuritis and  she was sent to the emergency department here.  Labs were unremarkable.  MRI of the brain and orbits showed finding of left optic neuritis.  Neurology consulted and was started on Solu-Medrol 1 g daily.  Assessment & Plan:   Active Problems:   Left optic neuritis   Acute left optic neuritis: Neurology was following.  Plan is to complete 5 days of IV Solu-Medrol 1 g.  Last dose tomorrow.  She still complains of left eye blurry vision/dark spots. She should follow-up with neurology as an outpatient. MRI of the brain, thoracic, and cervical spine did not show any demyelinating lesions so the diagnosis of multiple sclerosis is not supported.  She needs serial MRI scans as an outpatient because she has high risks of developing MS given her episode of optic neuritis.  Elevated blood pressure on admission: Currently stable without any medications           DVT prophylaxis: Lovenox Code Status: Full Family Communication: None present at the bed side Disposition Plan: Home tomorrow   Consultants: Neurology  Procedures:MRi  Antimicrobials:  Anti-infectives (From admission, onward)   None      Subjective: Patient seen and examined the bedside this morning.  Hemodynamically stable.  Her left eye vision is still little blurry.  Denies any other complaints  Objective: Vitals:   02/19/19 0800 02/19/19 1925 02/19/19 2045 02/20/19 0600  BP: 117/87 127/85 (!) 134/101 112/79  Pulse: 88 89 73 62  Resp: 16 16 16 16   Temp: 98.2 F (36.8 C) 98.7 F (37.1 C) 98.8 F (37.1 C) 98.7 F (37.1 C)  TempSrc: Oral Oral Oral Oral  SpO2:  100% 100% 100% 100%  Weight:      Height:        Intake/Output Summary (Last 24 hours) at 02/20/2019 1319 Last data filed at 02/20/2019 1100 Gross per 24 hour  Intake 356 ml  Output -  Net 356 ml   Filed Weights   02/17/19 1733  Weight: 59 kg    Examination:  General exam: Appears calm and comfortable ,Not in distress,average built HEENT:PERRL,Oral mucosa moist, Ear/Nose normal on gross exam Respiratory system: Bilateral equal air entry, normal vesicular breath sounds, no wheezes or crackles  Cardiovascular system: S1 & S2 heard, RRR. No JVD, murmurs, rubs, gallops or clicks. No pedal edema. Gastrointestinal system: Abdomen is nondistended, soft and nontender. No organomegaly or masses felt. Normal bowel sounds heard. Central nervous system: Alert and oriented. No focal neurological deficits. Extremities: No edema, no clubbing ,no cyanosis, distal peripheral pulses palpable. Skin: No rashes, lesions or ulcers,no icterus ,no pallor MSK: Normal muscle bulk,tone ,power Psychiatry: Judgement and insight appear normal. Mood & affect appropriate.     Data Reviewed: I have personally reviewed following labs and imaging studies  CBC: Recent Labs  Lab 02/17/19 1157  WBC 4.5  NEUTROABS 1.8  HGB 13.9  HCT 39.3  MCV 90.1  PLT 614   Basic Metabolic Panel: Recent Labs  Lab 02/17/19 1157  NA 138  K 3.7  CL 106  CO2 23  GLUCOSE 96  BUN 10  CREATININE 0.64  CALCIUM 9.7  GFR: Estimated Creatinine Clearance: 94.2 mL/min (by C-G formula based on SCr of 0.64 mg/dL). Liver Function Tests: No results for input(s): AST, ALT, ALKPHOS, BILITOT, PROT, ALBUMIN in the last 168 hours. No results for input(s): LIPASE, AMYLASE in the last 168 hours. No results for input(s): AMMONIA in the last 168 hours. Coagulation Profile: No results for input(s): INR, PROTIME in the last 168 hours. Cardiac Enzymes: No results for input(s): CKTOTAL, CKMB, CKMBINDEX, TROPONINI in the last 168  hours. BNP (last 3 results) No results for input(s): PROBNP in the last 8760 hours. HbA1C: No results for input(s): HGBA1C in the last 72 hours. CBG: No results for input(s): GLUCAP in the last 168 hours. Lipid Profile: No results for input(s): CHOL, HDL, LDLCALC, TRIG, CHOLHDL, LDLDIRECT in the last 72 hours. Thyroid Function Tests: No results for input(s): TSH, T4TOTAL, FREET4, T3FREE, THYROIDAB in the last 72 hours. Anemia Panel: No results for input(s): VITAMINB12, FOLATE, FERRITIN, TIBC, IRON, RETICCTPCT in the last 72 hours. Sepsis Labs: No results for input(s): PROCALCITON, LATICACIDVEN in the last 168 hours.  Recent Results (from the past 240 hour(s))  SARS CORONAVIRUS 2 (TAT 6-24 HRS) Nasopharyngeal Nasopharyngeal Swab     Status: None   Collection Time: 02/17/19  3:33 PM   Specimen: Nasopharyngeal Swab  Result Value Ref Range Status   SARS Coronavirus 2 NEGATIVE NEGATIVE Final    Comment: (NOTE) SARS-CoV-2 target nucleic acids are NOT DETECTED. The SARS-CoV-2 RNA is generally detectable in upper and lower respiratory specimens during the acute phase of infection. Negative results do not preclude SARS-CoV-2 infection, do not rule out co-infections with other pathogens, and should not be used as the sole basis for treatment or other patient management decisions. Negative results must be combined with clinical observations, patient history, and epidemiological information. The expected result is Negative. Fact Sheet for Patients: HairSlick.no Fact Sheet for Healthcare Providers: quierodirigir.com This test is not yet approved or cleared by the [place] FDA and  has been authorized for detection and/or diagnosis of SARS-CoV-2 by FDA under an Emergency Use Authorization (EUA). This EUA will remain  in effect (meaning this test can be used) for the duration of the COVID-19 declaration under Section 56 4(b)(1) of  the Act, 21 U.S.C. section 360bbb-3(b)(1), unless the authorization is terminated or revoked sooner. Performed at [facility], [address], [place], [place] [address]          Radiology Studies: No results found.      Scheduled Meds: . enoxaparin (LOVENOX) injection  40 mg Subcutaneous Q24H  . pantoprazole  40 mg Oral Daily  . sodium chloride flush  3 mL Intravenous Q12H   Continuous Infusions: . methylPREDNISolone (SOLU-MEDROL) injection 1,000 mg (02/20/19 1027)     LOS: 3 days    Time spent: 35 mins.More than 50% of that time was spent in counseling and/or coordination of care.      Burnadette Pop, MD [facility] Pager [phone]  If 7PM-7AM, please contact night-coverage [email or web address] Password [email or web address] 02/20/2019, 1:19 PM

## 2019-02-20 NOTE — Progress Notes (Signed)
   02/20/19 2055  MEWS Score  Pulse Rate 79  BP 125/82  Temp 99 F (37.2 C)  SpO2 100 %  O2 Device Room Air  MEWS Score  MEWS RR 0  MEWS Pulse 0  MEWS Systolic 0  MEWS LOC 0  MEWS Temp 0  MEWS Score 0  MEWS Score Color Green  MEWS Assessment  Is this an acute change? No  MEWS Guidelines - (patients age 28 and over)  Red - At High Risk for Deterioration Yellow - At risk for Deterioration  1. Go to room and assess patient 2. Validate data. Is this patient's baseline? If data confirmed: 3. Is this an acute change? 4. Administer prn meds/treatments as ordered. 5. Note Sepsis score 6. Review goals of care 7. Sports coach, RRT nurse and Provider. 8. Ask Provider to come to bedside.  9. Document patient condition/interventions/response. 10. Increase frequency of vital signs and focused assessments to at least q15 minutes x 4, then q30 minutes x2. - If stable, then q1h x3, then q4h x3 and then q8h or dept. routine. - If unstable, contact Provider & RRT nurse. Prepare for possible transfer. 11. Add entry in progress notes using the smart phrase ".MEWS". 1. Go to room and assess patient 2. Validate data. Is this patient's baseline? If data confirmed: 3. Is this an acute change? 4. Administer prn meds/treatments as ordered? 5. Note Sepsis score 6. Review goals of care 7. Sports coach and Provider 8. Call RRT nurse as needed. 9. Document patient condition/interventions/response. 10. Increase frequency of vital signs and focused assessments to at least q2h x2. - If stable, then q4h x2 and then q8h or dept. routine. - If unstable, contact Provider & RRT nurse. Prepare for possible transfer. 11. Add entry in progress notes using the smart phrase ".MEWS".  Green - Likely stable Lavender - Comfort Care Only  1. Continue routine/ordered monitoring.  2. Review goals of care. 1. Continue routine/ordered monitoring. 2. Review goals of care.

## 2019-02-20 NOTE — Plan of Care (Signed)

## 2019-02-21 ENCOUNTER — Other Ambulatory Visit: Payer: Self-pay

## 2019-02-21 ENCOUNTER — Encounter: Payer: Self-pay | Admitting: Neurology

## 2019-02-21 ENCOUNTER — Ambulatory Visit: Payer: BLUE CROSS/BLUE SHIELD | Admitting: Neurology

## 2019-02-21 ENCOUNTER — Encounter: Payer: Self-pay | Admitting: *Deleted

## 2019-02-21 VITALS — BP 117/75 | HR 69 | Temp 98.0°F | Ht 65.0 in | Wt 139.5 lb

## 2019-02-21 DIAGNOSIS — H469 Unspecified optic neuritis: Secondary | ICD-10-CM | POA: Diagnosis not present

## 2019-02-21 NOTE — Progress Notes (Signed)
Pt given discharge instructions and care notes. Pt verbalized understanding AEB no further questions or concerns at this time. IV was discontinued, no redness, pain, or swelling noted at this time. Pt left the floor with staff in stable condition.

## 2019-02-21 NOTE — Discharge Summary (Signed)
Physician Discharge Summary  Tytiana Coles WUJ:[id number] DOB: 1990-06-05 DOA: 02/17/2019  PCP: Mendel Ryder, PA-C  Admit date: 02/17/2019 Discharge date: 02/21/2019  Admitted From: Home Disposition:  Home  Discharge Condition:Stable CODE STATUS:FULL Diet recommendation:  Regular    Brief/Interim Summary:  Patient is a 28 year old female with no significant past medical history who presents with left eye pain, blurry vision on the left eye.  She was seen by ophthalmologist as an outpatient and there was concern for optic neuritis and  she was sent to the emergency department here.  Labs were unremarkable.  MRI of the brain and orbits showed finding of left optic neuritis.  Neurology consulted and was started on Solu-Medrol 1 g daily.  She completed 5 days treatment.  Patient is medically stable for discharge home today.  She needs to follow-up with ophthalmology as soon as possible as an outpatient.  Will recommend to follow-up with [facility] neurology in 4 weeks.  Following problems were addressed during her hospitalization :  Acute left optic neuritis: Neurology was following.  Plan is to complete 5 days of IV Solu-Medrol 1 g.  Last dose tomorrow.  She still complains of left eye blurry vision/dark spots. She should follow-up with ophthalmology/neurology as an outpatient. MRI of the brain, thoracic, and cervical spine did not show any demyelinating lesions so the diagnosis of multiple sclerosis is not supported.  She needs serial MRI scans as an outpatient because she has high risks of developing MS given her episode of optic neuritis.  Elevated blood pressure on admission: Currently stable without any medications.    Discharge Diagnoses:  Active Problems:   Left optic neuritis    Discharge Instructions  Discharge Instructions    Ambulatory referral to Neurology   Complete by: As directed    An appointment is requested in approximately: 4 weeks   Diet - low sodium heart  healthy   Complete by: As directed    Discharge instructions   Complete by: As directed    1)Please follow up with Ophthalmology within a week. 2)Follow up with neurology in 4 weeks.  Name and number the provider group has been attached.   Increase activity slowly   Complete by: As directed      Allergies as of 02/21/2019      Reactions   Compazine [prochlorperazine] Other (See Comments)   Pt states she was given this through her IV during an ER visit back in 2015 for a migraine and it caused restlessness and couldn't breath. States she had to come back and was given benadryl.        Medication List    STOP taking these medications   sulfacetamide 10 % ophthalmic solution Commonly known as: BLEPH-10     TAKE these medications   acetaminophen 500 MG tablet Commonly known as: TYLENOL Take 500 mg by mouth every 6 (six) hours as needed for mild pain or moderate pain.   betamethasone dipropionate 0.05 % cream Commonly known as: DIPROLENE Apply 1-2 application topically daily. Use for 14 days   diphenhydrAMINE 25 MG tablet Commonly known as: BENADRYL Take 12.5 mg by mouth every 6 (six) hours as needed for sleep.   ibuprofen 200 MG tablet Commonly known as: ADVIL Take 600 mg by mouth every 6 (six) hours as needed for mild pain or moderate pain.   Melatonin 1 MG/ML Liqd Take 1 mg by mouth at bedtime as needed (sleep).   MULTIVITAMIN ADULT PO Take 1 tablet by mouth daily.  solifenacin 10 MG tablet Commonly known as: VESICARE Take 10 mg by mouth daily.      Follow-up Information    Forrestine Him, [place]. Schedule an appointment as soon as possible for a visit in 1 week(s).   Specialty: Family Medicine Contact information: [address] [place] [address] [phone]        [facility]. Schedule an appointment as soon as possible for a visit in 4 week(s).   Specialty: Neurology Contact information: [place] [phone]         Allergies  Allergen Reactions  . Compazine [Prochlorperazine] Other (See Comments)    Pt states she was given this through her IV during an ER visit back in 2015 for a migraine and it caused restlessness and couldn't breath. States she had to come back and was given benadryl.      Consultations:  Neurology   Procedures/Studies: Mr Jeri Cos And Wo Contrast  Result Date: 02/17/2019 CLINICAL DATA:  Optic neuritis.  Blurred vision for 1 week. EXAM: MRI HEAD AND ORBITS WITHOUT AND WITH CONTRAST TECHNIQUE: Multiplanar, multiecho pulse sequences of the brain and surrounding structures were obtained without and with intravenous contrast. Multiplanar, multiecho pulse sequences of the orbits and surrounding structures were obtained including fat saturation techniques, before and after intravenous contrast administration. CONTRAST:  66mL GADAVIST GADOBUTROL 1 MMOL/ML IV SOLN COMPARISON:  None. FINDINGS: MRI HEAD FINDINGS Brain: There is no evidence of acute infarct, intracranial hemorrhage, mass, midline shift, or extra-axial fluid collection. The ventricles and sulci are normal. The brain is normal in signal. No abnormal enhancement is identified. Vascular: Major intracranial vascular flow voids are preserved. Skull and upper cervical spine: Unremarkable bone marrow signal. Other: None. MRI ORBITS FINDINGS Orbits: Mild motion artifact and incomplete fat suppression primarily in the left orbit limit assessment. However, there is still the suggestion of true abnormal T2 hyperintensity and enhancement associated with the intraorbital and canalicular segments of the left optic nerve with the left optic nerve also appearing mildly enlarged. The right optic nerve is normal in appearance. No orbital mass is identified. The globes appear intact. The extraocular muscles and lacrimal glands are symmetric and normal in appearance. Visualized sinuses: Minimal  mucosal thickening in the paranasal sinuses. Clear mastoid air cells. Soft tissues: Unremarkable. Limited intracranial: Unremarkable appearance of the optic chiasm and pituitary gland. IMPRESSION: 1. Findings suspicious for left optic neuritis. 2. Unremarkable appearance of the brain. Electronically Signed   By: Logan Bores M.D.   On: 02/17/2019 14:00   Mr Cervical Spine Wo Contrast  Result Date: 02/18/2019 CLINICAL DATA:  Initial evaluation for multiple sclerosis, new neurologic event. Brisk right patellar reflex with cross adductor. EXAM: MRI CERVICAL AND THORACIC SPINE WITHOUT CONTRAST TECHNIQUE: Multiplanar and multiecho pulse sequences of the cervical spine, to include the craniocervical junction and cervicothoracic junction, and the thoracic spine, were obtained without intravenous contrast. COMPARISON:  Previous MRI of the brain and orbits from 02/17/2019. FINDINGS: MRI CERVICAL SPINE FINDINGS Alignment: Mild levoscoliosis with straightening of the normal cervical lordosis. No listhesis or subluxation. Vertebrae: Vertebral body height maintained without evidence for acute or chronic fracture. Bone marrow signal intensity within normal limits. No discrete or worrisome osseous lesions. No abnormal marrow edema. Cord: Signal intensity within the cervical spinal cord is normal. No cord signal abnormality to suggest demyelinating disease. Normal cord caliber and morphology. Posterior Fossa, vertebral arteries, paraspinal tissues: Visualized brain and posterior fossa within  normal limits. Craniocervical junction normal. Paraspinous and prevertebral soft tissues within normal limits. Normal intravascular flow voids seen within the vertebral arteries bilaterally. Disc levels: No significant disc pathology seen within the cervical spine. No significant facet degeneration. No canal or neural foraminal stenosis. No impingement. MRI THORACIC SPINE FINDINGS Alignment: Vertebral bodies normally aligned with  preservation of the normal thoracic kyphosis. No listhesis or subluxation. Vertebrae: Vertebral body height maintained without evidence for acute or chronic fracture. Bone marrow signal intensity within normal limits. No discrete or worrisome osseous lesions. No abnormal marrow edema. Cord: Signal intensity within the thoracic spinal cord is normal. No cord signal abnormality to suggest demyelinating disease. Normal cord caliber morphology. Conus medullaris terminates at the L1 level. Paraspinal and other soft tissues: Paraspinous soft tissues within normal limits. Visualized lungs are clear. Visualized visceral structures unremarkable. Disc levels: T5-6: Small right paracentral disc protrusion contacts the right ventral spinal cord (series 19, image 16). Minimal flattening of the right ventral cord without significant stenosis. No other significant disc pathology seen within the thoracic spine. No other stenosis or impingement. IMPRESSION: 1. Normal MRI appearance of the cervical spinal cord. No evidence for demyelinating disease or other abnormality. 2. Normal MRI appearance of the thoracic spinal cord. No evidence for demyelinating disease. 3. Right paracentral disc protrusion at T5-6 with minimal flattening of the right ventral hemicord. No significant stenosis. Electronically Signed   By: Rise Mu M.D.   On: 02/18/2019 01:33   Mr Thoracic Spine Wo Contrast  Result Date: 02/18/2019 CLINICAL DATA:  Initial evaluation for multiple sclerosis, new neurologic event. Brisk right patellar reflex with cross adductor. EXAM: MRI CERVICAL AND THORACIC SPINE WITHOUT CONTRAST TECHNIQUE: Multiplanar and multiecho pulse sequences of the cervical spine, to include the craniocervical junction and cervicothoracic junction, and the thoracic spine, were obtained without intravenous contrast. COMPARISON:  Previous MRI of the brain and orbits from 02/17/2019. FINDINGS: MRI CERVICAL SPINE FINDINGS Alignment: Mild  levoscoliosis with straightening of the normal cervical lordosis. No listhesis or subluxation. Vertebrae: Vertebral body height maintained without evidence for acute or chronic fracture. Bone marrow signal intensity within normal limits. No discrete or worrisome osseous lesions. No abnormal marrow edema. Cord: Signal intensity within the cervical spinal cord is normal. No cord signal abnormality to suggest demyelinating disease. Normal cord caliber and morphology. Posterior Fossa, vertebral arteries, paraspinal tissues: Visualized brain and posterior fossa within normal limits. Craniocervical junction normal. Paraspinous and prevertebral soft tissues within normal limits. Normal intravascular flow voids seen within the vertebral arteries bilaterally. Disc levels: No significant disc pathology seen within the cervical spine. No significant facet degeneration. No canal or neural foraminal stenosis. No impingement. MRI THORACIC SPINE FINDINGS Alignment: Vertebral bodies normally aligned with preservation of the normal thoracic kyphosis. No listhesis or subluxation. Vertebrae: Vertebral body height maintained without evidence for acute or chronic fracture. Bone marrow signal intensity within normal limits. No discrete or worrisome osseous lesions. No abnormal marrow edema. Cord: Signal intensity within the thoracic spinal cord is normal. No cord signal abnormality to suggest demyelinating disease. Normal cord caliber morphology. Conus medullaris terminates at the L1 level. Paraspinal and other soft tissues: Paraspinous soft tissues within normal limits. Visualized lungs are clear. Visualized visceral structures unremarkable. Disc levels: T5-6: Small right paracentral disc protrusion contacts the right ventral spinal cord (series 19, image 16). Minimal flattening of the right ventral cord without significant stenosis. No other significant disc pathology seen within the thoracic spine. No other stenosis or impingement.  IMPRESSION: 1. Normal  MRI appearance of the cervical spinal cord. No evidence for demyelinating disease or other abnormality. 2. Normal MRI appearance of the thoracic spinal cord. No evidence for demyelinating disease. 3. Right paracentral disc protrusion at T5-6 with minimal flattening of the right ventral hemicord. No significant stenosis. Electronically Signed   By: Rise MuBenjamin  McClintock M.D.   On: 02/18/2019 01:33   Mr Rockwell GermanyOrbits W ZOWo Contrast  Result Date: 02/17/2019 CLINICAL DATA:  Optic neuritis.  Blurred vision for 1 week. EXAM: MRI HEAD AND ORBITS WITHOUT AND WITH CONTRAST TECHNIQUE: Multiplanar, multiecho pulse sequences of the brain and surrounding structures were obtained without and with intravenous contrast. Multiplanar, multiecho pulse sequences of the orbits and surrounding structures were obtained including fat saturation techniques, before and after intravenous contrast administration. CONTRAST:  5mL GADAVIST GADOBUTROL 1 MMOL/ML IV SOLN COMPARISON:  None. FINDINGS: MRI HEAD FINDINGS Brain: There is no evidence of acute infarct, intracranial hemorrhage, mass, midline shift, or extra-axial fluid collection. The ventricles and sulci are normal. The brain is normal in signal. No abnormal enhancement is identified. Vascular: Major intracranial vascular flow voids are preserved. Skull and upper cervical spine: Unremarkable bone marrow signal. Other: None. MRI ORBITS FINDINGS Orbits: Mild motion artifact and incomplete fat suppression primarily in the left orbit limit assessment. However, there is still the suggestion of true abnormal T2 hyperintensity and enhancement associated with the intraorbital and canalicular segments of the left optic nerve with the left optic nerve also appearing mildly enlarged. The right optic nerve is normal in appearance. No orbital mass is identified. The globes appear intact. The extraocular muscles and lacrimal glands are symmetric and normal in appearance. Visualized  sinuses: Minimal mucosal thickening in the paranasal sinuses. Clear mastoid air cells. Soft tissues: Unremarkable. Limited intracranial: Unremarkable appearance of the optic chiasm and pituitary gland. IMPRESSION: 1. Findings suspicious for left optic neuritis. 2. Unremarkable appearance of the brain. Electronically Signed   By: Sebastian AcheAllen  Grady M.D.   On: 02/17/2019 14:00       Subjective:  Patient seen and examined the bedside this morning.  Hemodynamically stable for discharge.  Discharge Exam: Vitals:   02/20/19 2055 02/21/19 0410  BP: 125/82 120/83  Pulse: 79 (!) 52  Resp:    Temp: 99 F (37.2 C) 98.5 F (36.9 C)  SpO2: 100% 100%   Vitals:   02/20/19 0600 02/20/19 1403 02/20/19 2055 02/21/19 0410  BP: 112/79 132/83 125/82 120/83  Pulse: 62 91 79 (!) 52  Resp: 16 20    Temp: 98.7 F (37.1 C) 98.3 F (36.8 C) 99 F (37.2 C) 98.5 F (36.9 C)  TempSrc: Oral Oral    SpO2: 100% 99% 100% 100%  Weight:      Height:        General: Pt is alert, awake, not in acute distress Cardiovascular: RRR, S1/S2 +, no rubs, no gallops Respiratory: CTA bilaterally, no wheezing, no rhonchi Abdominal: Soft, NT, ND, bowel sounds + Extremities: no edema, no cyanosis    The results of significant diagnostics from this hospitalization (including imaging, microbiology, ancillary and laboratory) are listed below for reference.     Microbiology: Recent Results (from the past 240 hour(s))  SARS CORONAVIRUS 2 (TAT 6-24 HRS) Nasopharyngeal Nasopharyngeal Swab     Status: None   Collection Time: 02/17/19  3:33 PM   Specimen: Nasopharyngeal Swab  Result Value Ref Range Status   SARS Coronavirus 2 NEGATIVE NEGATIVE Final    Comment: (NOTE) SARS-CoV-2 target nucleic acids are NOT DETECTED. The SARS-CoV-2  RNA is generally detectable in upper and lower respiratory specimens during the acute phase of infection. Negative results do not preclude SARS-CoV-2 infection, do not rule out co-infections  with other pathogens, and should not be used as the sole basis for treatment or other patient management decisions. Negative results must be combined with clinical observations, patient history, and epidemiological information. The expected result is Negative. Fact Sheet for Patients: HairSlick.no Fact Sheet for Healthcare Providers: quierodirigir.com This test is not yet approved or cleared by the [place] FDA and  has been authorized for detection and/or diagnosis of SARS-CoV-2 by FDA under an Emergency Use Authorization (EUA). This EUA will remain  in effect (meaning this test can be used) for the duration of the COVID-19 declaration under Section 56 4(b)(1) of the Act, 21 U.S.C. section 360bbb-3(b)(1), unless the authorization is terminated or revoked sooner. Performed at [facility], [address], [place], [place] [address]      Labs: BNP (last 3 results) No results for input(s): BNP in the last 8760 hours. Basic Metabolic Panel: Recent Labs  Lab 02/17/19 1157  NA 138  K 3.7  CL 106  CO2 23  GLUCOSE 96  BUN 10  CREATININE 0.64  CALCIUM 9.7   Liver Function Tests: No results for input(s): AST, ALT, ALKPHOS, BILITOT, PROT, ALBUMIN in the last 168 hours. No results for input(s): LIPASE, AMYLASE in the last 168 hours. No results for input(s): AMMONIA in the last 168 hours. CBC: Recent Labs  Lab 02/17/19 1157  WBC 4.5  NEUTROABS 1.8  HGB 13.9  HCT 39.3  MCV 90.1  PLT 285   Cardiac Enzymes: No results for input(s): CKTOTAL, CKMB, CKMBINDEX, TROPONINI in the last 168 hours. BNP: Invalid input(s): POCBNP CBG: No results for input(s): GLUCAP in the last 168 hours. D-Dimer No results for input(s): DDIMER in the last 72 hours. Hgb A1c No results for input(s): HGBA1C in the last 72 hours. Lipid Profile No results for input(s): CHOL, HDL, LDLCALC, TRIG, CHOLHDL, LDLDIRECT in the last 72  hours. Thyroid function studies No results for input(s): TSH, T4TOTAL, T3FREE, THYROIDAB in the last 72 hours.  Invalid input(s): FREET3 Anemia work up No results for input(s): VITAMINB12, FOLATE, FERRITIN, TIBC, IRON, RETICCTPCT in the last 72 hours. Urinalysis    Component Value Date/Time   COLORURINE YELLOW 12/02/2011 2335   APPEARANCEUR CLEAR 12/02/2011 2335   LABSPEC 1.010 12/02/2011 2335   PHURINE 7.5 12/02/2011 2335   GLUCOSEU NEGATIVE 12/02/2011 2335   HGBUR TRACE (A) 12/02/2011 2335   BILIRUBINUR NEGATIVE 12/02/2011 2335   KETONESUR NEGATIVE 12/02/2011 2335   PROTEINUR NEGATIVE 12/02/2011 2335   UROBILINOGEN 2.0 (H) 12/02/2011 2335   NITRITE NEGATIVE 12/02/2011 2335   LEUKOCYTESUR NEGATIVE 12/02/2011 2335   Sepsis Labs Invalid input(s): PROCALCITONIN,  WBC,  LACTICIDVEN Microbiology Recent Results (from the past 240 hour(s))  SARS CORONAVIRUS 2 (TAT 6-24 HRS) Nasopharyngeal Nasopharyngeal Swab     Status: None   Collection Time: 02/17/19  3:33 PM   Specimen: Nasopharyngeal Swab  Result Value Ref Range Status   SARS Coronavirus 2 NEGATIVE NEGATIVE Final    Comment: (NOTE) SARS-CoV-2 target nucleic acids are NOT DETECTED. The SARS-CoV-2 RNA is generally detectable in upper and lower respiratory specimens during the acute phase of infection. Negative results do not preclude SARS-CoV-2 infection, do not rule out co-infections with other pathogens, and should not be used as the sole basis for treatment or other patient management decisions. Negative results must be combined  with clinical observations, patient history, and epidemiological information. The expected result is Negative. Fact Sheet for Patients: HairSlick.no Fact Sheet for Healthcare Providers: quierodirigir.com This test is not yet approved or cleared by the [place] FDA and  has been authorized for detection and/or diagnosis of SARS-CoV-2  by FDA under an Emergency Use Authorization (EUA). This EUA will remain  in effect (meaning this test can be used) for the duration of the COVID-19 declaration under Section 56 4(b)(1) of the Act, 21 U.S.C. section 360bbb-3(b)(1), unless the authorization is terminated or revoked sooner. Performed at [facility], [address], [place], [place] [address]     Please note: You were cared for by a hospitalist during your hospital stay. Once you are discharged, your primary care physician will handle any further medical issues. Please note that NO REFILLS for any discharge medications will be authorized once you are discharged, as it is imperative that you return to your primary care physician (or establish a relationship with a primary care physician if you do not have one) for your post hospital discharge needs so that they can reassess your need for medications and monitor your lab values.    Time coordinating discharge: 40 minutes  SIGNED:   Burnadette Pop, MD  [facility] 02/21/2019, 10:52 AM Pager [phone]  If 7PM-7AM, please contact night-coverage [email or web address] Password [email or web address]

## 2019-02-21 NOTE — Progress Notes (Addendum)
PATIENT: Terri Taylor DOB: June 28, 1990  Chief Complaint  Patient presents with  . Optic Neuritis    R-20/20, L-20/30-1 with glasses. She is here for hospital follow up for left optic neuritis. MRI scans of brain, orbits, cervical and thoracic areas have been completed.  Her pain improved with IV solu-medrol over five days but she has continued to have blotchy, dark vision.  She has also consulted with ophthalmology (Dr. Manuella Ghazi at [facility]).  She is concerned about her history of taking Elmiron for nearly two years (from 2016-2018).  . PCP    Arneta Cliche (referred from hospital)     HISTORICAL  Terri Taylor is a 28 year old female, seen in request by her primary care PA Terri Taylor for evaluation of left optic neuritis, initial evaluation was on February 21, 2019.  I have reviewed and summarized the referring note from the referring physician.  She had a history of interstitial cystitis, taking Vesicare, on February 12 2019, she woke up noticed left eye was painful when moving, also blotchy dark spots in her left visual field, she was seen by her optometrist initially, then referred to ophthalmologist Dr. Manuella Ghazi at [facility], was diagnosed with left optic neuritis, her hospital admission on February 17, 2019, was treated with IV Solu-Medrol for 5 days  Personally reviewed MRIs, MRI of brain and orbits with without contrast showed abnormal T2 hyperintensity enhancement associated with intraorbital and canalicular segment of optic nerve, left optic nerve also appearing mildly enlarged.  Right optic nerve is normal in appearance.  MRI of cervical, thoracic spine showed no significant abnormality.  Laboratory evaluations showed normal ESR, C-reactive protein, BMP, CBC.  REVIEW OF SYSTEMS: Full 14 system review of systems performed and notable only for as above All other review of systems were negative.  ALLERGIES: Allergies  Allergen Reactions  .  Compazine [Prochlorperazine] Other (See Comments)    Pt states she was given this through her IV during an ER visit back in 2015 for a migraine and it caused restlessness and couldn't breath. States she had to come back and was given benadryl.      HOME MEDICATIONS: Current Outpatient Medications  Medication Sig Dispense Refill  . acetaminophen (TYLENOL) 500 MG tablet Take 500 mg by mouth every 6 (six) hours as needed for mild pain or moderate pain.    Marland Kitchen betamethasone dipropionate (DIPROLENE) 0.05 % cream Apply 1-2 application topically daily. Use for 14 days    . diphenhydrAMINE (BENADRYL) 25 MG tablet Take 12.5 mg by mouth every 6 (six) hours as needed for sleep.    Marland Kitchen ibuprofen (ADVIL) 200 MG tablet Take 600 mg by mouth every 6 (six) hours as needed for mild pain or moderate pain.    . Melatonin 1 MG/ML LIQD Take 1 mg by mouth at bedtime as needed (sleep).    . Multiple Vitamins-Minerals (MULTIVITAMIN ADULT PO) Take 1 tablet by mouth daily.    . solifenacin (VESICARE) 10 MG tablet Take 10 mg by mouth daily.     No current facility-administered medications for this visit.     PAST MEDICAL HISTORY: Past Medical History:  Diagnosis Date  . Bladder pain   . Eczema   . Frequency of urination   . Interstitial cystitis   . Nocturia   . Optic neuritis, left   . Urgency of urination   . Wears contact lenses     PAST SURGICAL HISTORY: Past Surgical History:  Procedure Laterality Date  .  CYSTO WITH HYDRODISTENSION N/A 10/21/2014   Procedure: CYSTOSCOPY/HYDRODISTENSION MARCAINE AND PYRIDIUM ;  Surgeon: [name], MD;  Location: [facility];  Service: Urology;  Laterality: N/A;    FAMILY HISTORY: Family History  Problem Relation Age of Onset  . Healthy Mother   . Healthy Father   . Diabetes Maternal Grandfather     SOCIAL HISTORY: Social History   Socioeconomic History  . Marital status: Single    Spouse name: Not on file  . Number of children: 0  . Years of  education: college  . Highest education level: Bachelor's degree (e.g., BA, AB, BS)  Occupational History  . Occupation: CNA  Social Needs  . Financial resource strain: Not on file  . Food insecurity    Worry: Not on file    Inability: Not on file  . Transportation needs    Medical: Not on file    Non-medical: Not on file  Tobacco Use  . Smoking status: Never Smoker  . Smokeless tobacco: Never Used  Substance and Sexual Activity  . Alcohol use: No  . Drug use: No  . Sexual activity: Yes  Lifestyle  . Physical activity    Days per week: Not on file    Minutes per session: Not on file  . Stress: Not on file  Relationships  . Social Herbalist on phone: Not on file    Gets together: Not on file    Attends religious service: Not on file    Active member of club or organization: Not on file    Attends meetings of clubs or organizations: Not on file    Relationship status: Not on file  . Intimate partner violence    Fear of current or ex partner: Not on file    Emotionally abused: Not on file    Physically abused: Not on file    Forced sexual activity: Not on file  Other Topics Concern  . Not on file  Social History Narrative   Lives at home with mother.   Right-handed.   One cup caffeine per day.     PHYSICAL EXAM   Vitals:   02/21/19 1521  BP: 117/75  Pulse: 69  Temp: 98 F (36.7 C)  Weight: 139 lb 8 oz (63.3 kg)  Height: 5' 5"  (1.651 m)    Not recorded      Body mass index is 23.21 kg/m.  PHYSICAL EXAMNIATION:  Gen: NAD, conversant, well nourised, well groomed                     Cardiovascular: Regular rate rhythm, no peripheral edema, warm, nontender. Eyes: Conjunctivae clear without exudates or hemorrhage Neck: Supple, no carotid bruits. Pulmonary: Clear to auscultation bilaterally   NEUROLOGICAL EXAM:  MENTAL STATUS: Speech:    Speech is normal; fluent and spontaneous with normal comprehension.  Cognition:     Orientation to  time, place and person     Normal recent and remote memory     Normal Attention span and concentration     Normal Language, naming, repeating,spontaneous speech     Fund of knowledge   CRANIAL NERVES: CN II: Visual fields are full to confrontation.  Pupils are round equal and briskly reactive to light. CN III, IV, VI: extraocular movement are normal. No ptosis. CN V: Facial sensation is intact to pinprick in all 3 divisions bilaterally. Corneal responses are intact.  CN VII: Face is symmetric with normal eye closure  and smile. CN VIII: Hearing is normal to causal conversation. CN IX, X: Palate elevates symmetrically. Phonation is normal. CN XI: Head turning and shoulder shrug are intact CN XII: Tongue is midline with normal movements and no atrophy.  MOTOR: There is no pronator drift of out-stretched arms. Muscle bulk and tone are normal. Muscle strength is normal.  REFLEXES: Reflexes are 2+ and symmetric at the biceps, triceps, knees, and ankles. Plantar responses are flexor.  SENSORY: Intact to light touch, pinprick, positional sensation and vibratory sensation are intact in fingers and toes.  COORDINATION: Rapid alternating movements and fine finger movements are intact. There is no dysmetria on finger-to-nose and heel-knee-shin.    GAIT/STANCE: Posture is normal. Gait is steady with normal steps, base, arm swing, and turning. Heel and toe walking are normal. Tandem gait is normal.  Romberg is absent.   DIAGNOSTIC DATA (LABS, IMAGING, TESTING) - I reviewed patient records, labs, notes, testing and imaging myself where available.   ASSESSMENT AND PLAN  Terri Taylor is a 28 y.o. female   Left optic neuritis  Laboratory evaluations to rule out treatable etiology  Fluoroscopy guided lumbar puncture  No evidence of brain lesions, not enough evidence to make the diagnosis of MS at this point  Return to clinic in 2 months   Marcial Pacas, M.D. Ph.D.  [facility] [address] [place], [place] [address] Ph: [phone] Fax: [phone]  CC: Referring Provider  Addendum: Reviewed ophthalmology evaluation by cath rehab letter dated March 12, 2020, annual cornea scar OD, stable OS optic neuritis, no disc edema, no maculopathy, vision has been improving, status post IV steroid,

## 2019-02-26 ENCOUNTER — Telehealth: Payer: Self-pay | Admitting: Neurology

## 2019-02-26 LAB — PROTEIN ELECTROPHORESIS
A/G Ratio: 1.2 (ref 0.7–1.7)
Albumin ELP: 3.6 g/dL (ref 2.9–4.4)
Alpha 1: 0.2 g/dL (ref 0.0–0.4)
Alpha 2: 0.8 g/dL (ref 0.4–1.0)
Beta: 1.2 g/dL (ref 0.7–1.3)
Gamma Globulin: 0.9 g/dL (ref 0.4–1.8)
Globulin, Total: 3.1 g/dL (ref 2.2–3.9)
Total Protein: 6.7 g/dL (ref 6.0–8.5)

## 2019-02-26 LAB — RPR: RPR Ser Ql: NONREACTIVE

## 2019-02-26 LAB — HEPATITIS PANEL, ACUTE
Hep A IgM: NEGATIVE
Hep B C IgM: NEGATIVE
Hep C Virus Ab: <0.1 s/co ratio (ref 0.0–0.9)
Hepatitis B Surface Ag: NEGATIVE

## 2019-02-26 LAB — HIV ANTIBODY (ROUTINE TESTING W REFLEX): HIV Screen 4th Generation wRfx: NONREACTIVE

## 2019-02-26 LAB — VITAMIN D 25 HYDROXY (VIT D DEFICIENCY, FRACTURES): Vit D, 25-Hydroxy: 33.7 ng/mL (ref 30.0–100.0)

## 2019-02-26 LAB — FOLATE: Folate: 14.6 ng/mL (ref >3.0)

## 2019-02-26 LAB — VITAMIN B12: Vitamin B-12: 721 pg/mL (ref 232–1245)

## 2019-02-26 LAB — COPPER, SERUM: Copper: 128 ug/dL (ref 72–166)

## 2019-02-26 LAB — FERRITIN: Ferritin: 204 ng/mL — ABNORMAL HIGH (ref 15–150)

## 2019-02-26 LAB — ANGIOTENSIN CONVERTING ENZYME: Angio Convert Enzyme: 27 U/L (ref 14–82)

## 2019-02-26 LAB — B. BURGDORFI ANTIBODIES: Lyme IgG/IgM Ab: <0.91 {ISR} (ref 0.00–0.90)

## 2019-02-26 LAB — TSH: TSH: 0.141 u[IU]/mL — ABNORMAL LOW (ref 0.450–4.500)

## 2019-02-26 LAB — ANA W/REFLEX IF POSITIVE: Anti Nuclear Antibody (ANA): NEGATIVE

## 2019-02-26 NOTE — Telephone Encounter (Signed)
Please call patient, laboratory evaluation showed decreased TSH 0.14, indicating hyperthyroid functional level.  Rest of the laboratory evaluation showed no significant abnormalities.  I have forward the lab to her pcp, she may contact her primary care physician for evaluation of hyperthyroidism

## 2019-02-27 ENCOUNTER — Inpatient Hospital Stay: Admission: RE | Admit: 2019-02-27 | Payer: BLUE CROSS/BLUE SHIELD | Source: Ambulatory Visit

## 2019-02-27 NOTE — Telephone Encounter (Addendum)
Left patient a detailed message, with results, on voicemail (ok per DPR).  I also stated twice that she will need to follow up with her PCP concerning her abnormal TSH.  Provided our number to call back with any questions.

## 2019-03-11 ENCOUNTER — Telehealth: Payer: Self-pay | Admitting: Neurology

## 2019-03-11 NOTE — Telephone Encounter (Signed)
I reviewed evaluation from [facility] Dr. Shelbie Proctor, since last visit, patient symptoms has much improved, left optic disc edema has resolved, right disc flat sharp, good color  Optic coherence tomography demonstrated the presence of normal macula bilaterally

## 2019-03-21 ENCOUNTER — Telehealth: Payer: Self-pay | Admitting: Neurology

## 2019-03-21 NOTE — Telephone Encounter (Signed)
Evaluation by Dr. Herbert Deaner on March 14, 2019, OS optic neuritis, status post IV steroid, vision has been improving, will repeat visual field testing,RNFL scans in several months

## 2019-03-27 ENCOUNTER — Telehealth: Payer: Self-pay | Admitting: Neurology

## 2019-03-27 NOTE — Telephone Encounter (Signed)
lvm to r/s 11/24 appt °

## 2019-04-01 ENCOUNTER — Encounter: Payer: Self-pay | Admitting: Neurology

## 2019-04-23 ENCOUNTER — Ambulatory Visit: Payer: Self-pay | Admitting: Neurology

## 2020-03-19 ENCOUNTER — Telehealth: Payer: Self-pay | Admitting: Neurology

## 2020-03-19 NOTE — Telephone Encounter (Signed)
I have suggested fluro-guided LP for this patient at initial evaluation, but was cancelled, received feedback from her ophthalmologist Dr. Elmer Picker,  Her visions are  are improving,  But LP was cancelled, no follow up visit is scheduled. Please call patient, make sure she is getting better, if she is OK to proceed with LP? If she decided to hold off LP, it is Ok, as long as her vision is getting better.

## 2020-03-23 NOTE — Telephone Encounter (Signed)
I spoke to the patient. Reports her vision is clear and she does not wish to move forward with the LP at this time. She also declined scheduling a follow up for now. She is going to continue seeing Dr. Elmer Picker closely and will call our office for any needs.

## 2020-03-23 NOTE — Telephone Encounter (Signed)
Left message requesting a return call.

## 2020-09-14 ENCOUNTER — Ambulatory Visit: Payer: BLUE CROSS/BLUE SHIELD | Admitting: Nurse Practitioner

## 2020-09-18 ENCOUNTER — Emergency Department (HOSPITAL_COMMUNITY)
Admission: EM | Admit: 2020-09-18 | Discharge: 2020-09-18 | Discharge status: Against Medical Advice | Payer: Medicaid Other

## 2020-09-18 NOTE — ED Notes (Signed)
Pt did not want to stay and left . 

## 2021-08-02 DIAGNOSIS — L309 Dermatitis, unspecified: Secondary | ICD-10-CM | POA: Diagnosis not present

## 2021-08-02 DIAGNOSIS — R197 Diarrhea, unspecified: Secondary | ICD-10-CM | POA: Diagnosis not present

## 2021-08-03 DIAGNOSIS — Z30011 Encounter for initial prescription of contraceptive pills: Secondary | ICD-10-CM | POA: Diagnosis not present

## 2021-08-03 DIAGNOSIS — Z01419 Encounter for gynecological examination (general) (routine) without abnormal findings: Secondary | ICD-10-CM | POA: Diagnosis not present

## 2021-08-03 DIAGNOSIS — Z124 Encounter for screening for malignant neoplasm of cervix: Secondary | ICD-10-CM | POA: Diagnosis not present

## 2021-08-03 DIAGNOSIS — Z1279 Encounter for screening for malignant neoplasm of other genitourinary organs: Secondary | ICD-10-CM | POA: Diagnosis not present

## 2021-08-03 DIAGNOSIS — Z130 Encounter for screening for diseases of the blood and blood-forming organs and certain disorders involving the immune mechanism: Secondary | ICD-10-CM | POA: Diagnosis not present

## 2021-08-20 DIAGNOSIS — L2089 Other atopic dermatitis: Secondary | ICD-10-CM | POA: Diagnosis not present

## 2021-09-21 DIAGNOSIS — Z30011 Encounter for initial prescription of contraceptive pills: Secondary | ICD-10-CM | POA: Diagnosis not present

## 2022-05-11 DIAGNOSIS — J018 Other acute sinusitis: Secondary | ICD-10-CM | POA: Diagnosis not present

## 2022-05-11 DIAGNOSIS — J029 Acute pharyngitis, unspecified: Secondary | ICD-10-CM | POA: Diagnosis not present

## 2022-05-26 ENCOUNTER — Telehealth: Payer: Self-pay

## 2022-05-26 NOTE — Telephone Encounter (Signed)
LVM. Sending mychart msg. AS, CMA 

## 2022-08-24 DIAGNOSIS — Z111 Encounter for screening for respiratory tuberculosis: Secondary | ICD-10-CM | POA: Diagnosis not present

## 2023-06-07 ENCOUNTER — Emergency Department (HOSPITAL_COMMUNITY): Payer: Medicaid Other

## 2023-06-07 ENCOUNTER — Encounter (HOSPITAL_COMMUNITY): Payer: Self-pay | Admitting: Emergency Medicine

## 2023-06-07 ENCOUNTER — Emergency Department (HOSPITAL_COMMUNITY)
Admission: EM | Admit: 2023-06-07 | Discharge: 2023-06-07 | Disposition: A | Discharge status: Home / Self Care | Payer: Medicaid Other | Attending: Emergency Medicine | Admitting: Emergency Medicine

## 2023-06-07 DIAGNOSIS — R519 Headache, unspecified: Secondary | ICD-10-CM | POA: Insufficient documentation

## 2023-06-07 DIAGNOSIS — H538 Other visual disturbances: Secondary | ICD-10-CM | POA: Diagnosis present

## 2023-06-07 DIAGNOSIS — H469 Unspecified optic neuritis: Secondary | ICD-10-CM | POA: Diagnosis not present

## 2023-06-07 LAB — HCG, SERUM, QUALITATIVE: Preg, Serum: NEGATIVE

## 2023-06-07 LAB — CBC WITH DIFFERENTIAL/PLATELET
Abs Immature Granulocytes: 0.01 10*3/uL (ref 0.00–0.07)
Basophils Absolute: 0 10*3/uL (ref 0.0–0.1)
Basophils Relative: 0 %
Eosinophils Absolute: 0.1 10*3/uL (ref 0.0–0.5)
Eosinophils Relative: 1 %
HCT: 36 % (ref 36.0–46.0)
Hemoglobin: 12.6 g/dL (ref 12.0–15.0)
Immature Granulocytes: 0 %
Lymphocytes Relative: 46 %
Lymphs Abs: 3.4 10*3/uL (ref 0.7–4.0)
MCH: 31.7 pg (ref 26.0–34.0)
MCHC: 35 g/dL (ref 30.0–36.0)
MCV: 90.5 fL (ref 80.0–100.0)
Monocytes Absolute: 0.4 10*3/uL (ref 0.1–1.0)
Monocytes Relative: 5 %
Neutro Abs: 3.5 10*3/uL (ref 1.7–7.7)
Neutrophils Relative %: 48 %
Platelets: 308 10*3/uL (ref 150–400)
RBC: 3.98 MIL/uL (ref 3.87–5.11)
RDW: 12.7 % (ref 11.5–15.5)
WBC: 7.4 10*3/uL (ref 4.0–10.5)
nRBC: 0 % (ref 0.0–0.2)

## 2023-06-07 LAB — COMPREHENSIVE METABOLIC PANEL
ALT: 16 U/L (ref 0–44)
AST: 16 U/L (ref 15–41)
Albumin: 4.3 g/dL (ref 3.5–5.0)
Alkaline Phosphatase: 45 U/L (ref 38–126)
Anion gap: 8 (ref 5–15)
BUN: 11 mg/dL (ref 6–20)
CO2: 22 mmol/L (ref 22–32)
Calcium: 9.3 mg/dL (ref 8.9–10.3)
Chloride: 107 mmol/L (ref 98–111)
Creatinine, Ser: 0.58 mg/dL (ref 0.44–1.00)
GFR, Estimated: >60 mL/min (ref >60)
Glucose, Bld: 124 mg/dL — ABNORMAL HIGH (ref 70–99)
Potassium: 3.3 mmol/L — ABNORMAL LOW (ref 3.5–5.1)
Sodium: 137 mmol/L (ref 135–145)
Total Bilirubin: 0.5 mg/dL (ref 0.0–1.2)
Total Protein: 7.5 g/dL (ref 6.5–8.1)

## 2023-06-07 MED ORDER — GADOBUTROL 1 MMOL/ML IV SOLN
6.0000 mL | Freq: Once | INTRAVENOUS | Status: AC | PRN
  Administered 2023-06-07: 6 mL via INTRAVENOUS

## 2023-06-07 NOTE — ED Provider Notes (Addendum)
 [place] EMERGENCY DEPARTMENT AT [facility] Provider Note   CSN: [id number] Arrival date & time: 06/07/23  1503     History  No chief complaint on file.   Terri Taylor is a 33 y.o. female.  Patient is a 33 year old female with a prior history of optic neuritis in the left eye that required high-dose steroids in 2020 but no prior history of MS and unknown cause who is presenting today from her ophthalmologist office due to abnormal findings in the left eye concerning for recurrent optic neuritis.  Patient reports since November anytime she wears her contacts she noticed there is some blurry vision in her eye.  She has also had occasional headaches.  She reports right now it is only slightly blurry but otherwise feels okay.  She denies any eye pain or discharge.  No complaints in the right eye.  The history is provided by the patient.       Home Medications Prior to Admission medications   Medication Sig Start Date End Date Taking? Authorizing Provider  acetaminophen  (TYLENOL ) 500 MG tablet Take 500 mg by mouth every 6 (six) hours as needed for mild pain or moderate pain.    [provider]  betamethasone dipropionate (DIPROLENE) 0.05 % cream Apply 1-2 application topically daily. Use for 14 days 01/09/19   [provider]  diphenhydrAMINE  (BENADRYL ) 25 MG tablet Take 12.5 mg by mouth every 6 (six) hours as needed for sleep.    [provider]  ibuprofen (ADVIL) 200 MG tablet Take 600 mg by mouth every 6 (six) hours as needed for mild pain or moderate pain.    [provider]  Melatonin 1 MG/ML LIQD Take 1 mg by mouth at bedtime as needed (sleep).    [provider]  Multiple Vitamins-Minerals (MULTIVITAMIN ADULT PO) Take 1 tablet by mouth daily.    [provider]  solifenacin (VESICARE) 10 MG tablet Take 10 mg by mouth daily. 01/25/19   [provider]      Allergies    Ciprofloxacin  and Compazine   [prochlorperazine ]    Review of Systems   Review of Systems  Physical Exam Updated Vital Signs BP 123/85 (BP Location: Right Arm)   Pulse 88   Temp 99 F (37.2 C) (Oral)   Resp 18   Ht 5' 5 (1.651 m)   Wt 63 kg   SpO2 100%   BMI 23.11 kg/m  Physical Exam Vitals and nursing note reviewed.  Constitutional:      General: She is not in acute distress.    Appearance: She is well-developed.  HENT:     Head: Normocephalic and atraumatic.  Eyes:     Extraocular Movements: Extraocular movements intact.     Conjunctiva/sclera: Conjunctivae normal.     Pupils: Pupils are equal, round, and reactive to light.  Cardiovascular:     Rate and Rhythm: Normal rate and regular rhythm.     Heart sounds: No murmur heard. Pulmonary:     Effort: Pulmonary effort is normal. No respiratory distress.     Breath sounds: Normal breath sounds. No wheezing or rales.  Abdominal:     General: There is no distension.     Palpations: Abdomen is soft.     Tenderness: There is no abdominal tenderness. There is no guarding or rebound.  Musculoskeletal:        General: No tenderness. Normal range of motion.     Cervical back: Normal range of motion and neck supple.  Skin:    General: Skin is warm and dry.     Findings: No erythema or rash.  Neurological:     Mental Status: She is alert and oriented to person, place, and time.     Sensory: No sensory deficit.     Motor: No weakness.     Gait: Gait normal.  Psychiatric:        Behavior: Behavior normal.     ED Results / Procedures / Treatments   Labs (all labs ordered are listed, but only abnormal results are displayed) Labs Reviewed  COMPREHENSIVE METABOLIC PANEL - Abnormal; Notable for the following components:      Result Value   Potassium 3.3 (*)    Glucose, Bld 124 (*)    All other components within normal limits  CBC WITH DIFFERENTIAL/PLATELET  HCG, SERUM, QUALITATIVE    EKG None  Radiology MR Brain W and Wo Contrast Result  Date: 06/07/2023 CLINICAL DATA:  Prior study from 02/17/2019. EXAM: MRI HEAD AND ORBITS WITHOUT AND WITH CONTRAST TECHNIQUE: Multiplanar, multiecho pulse sequences of the brain and surrounding structures were obtained without and with intravenous contrast. Multiplanar, multiecho pulse sequences of the orbits and surrounding structures were obtained including fat saturation techniques, before and after intravenous contrast administration. CONTRAST:  6mL GADAVIST  GADOBUTROL  1 MMOL/ML IV SOLN COMPARISON:  None Available. FINDINGS: MRI HEAD FINDINGS Brain: Examination degraded by motion artifact. Cerebral volume within normal limits. No focal parenchymal signal abnormality. No evidence for acute or subacute ischemia. Gray-white matter differentiation maintained. No acute or chronic intracranial blood products. No mass lesion, midline shift or mass effect. No hydrocephalus or extra-axial fluid collection. Pituitary gland suprasellar region within normal limits. No abnormal enhancement. Vascular: Major intracranial vascular flow voids are maintained. Skull and upper cervical spine: Cranial junction little limits. Bone marrow signal intensity normal. No scalp soft tissue abnormality. Other: Mastoid air cells are clear. MRI ORBITS FINDINGS Orbits: Examination degraded by motion artifact. Additionally, assessment somewhat limited as the axial postcontrast T1 sequence is not fat saturated, limiting assessment of the optic nerves. Globes are symmetric in size with normal appearance and morphology. There is asymmetric T2 signal intensity seen involving the left optic nerve (series 21, image 11). Patchy post-contrast enhancement seen within this region (series 26, image 12). Finding suggestive of acute left optic neuritis. No convincing changes of acute optic neuritis about the contralateral right optic nerve. Intraconal and extraconal fat maintained. Lacrimal glands normal. Extra-ocular muscles symmetric and normal. No  abnormality about the orbital apices or cavernous sinus. Visualized sinuses: Clear. Soft tissues: Unremarkable. IMPRESSION: MRI HEAD: Normal brain MRI. No acute intracranial abnormality. MRI ORBITS: 1. Motion degraded exam. 2. Asymmetric T2 signal intensity and enhancement involving the left optic nerve, suggestive of acute optic neuritis. Electronically Signed   By: Morene Hoard M.D.   On: 06/07/2023 21:19   MR ORBITS W WO CONTRAST Result Date: 06/07/2023 CLINICAL DATA:  Prior study from 02/17/2019. EXAM: MRI HEAD AND ORBITS WITHOUT AND WITH CONTRAST TECHNIQUE: Multiplanar, multiecho pulse sequences of the brain and surrounding structures were obtained without and with intravenous contrast. Multiplanar, multiecho pulse sequences of the orbits and surrounding structures were obtained including fat saturation techniques, before and after intravenous contrast administration. CONTRAST:  6mL GADAVIST  GADOBUTROL  1 MMOL/ML IV SOLN COMPARISON:  None Available. FINDINGS: MRI HEAD FINDINGS Brain: Examination degraded by motion artifact. Cerebral volume within normal limits. No focal parenchymal signal abnormality. No evidence for acute or subacute ischemia. Gray-white matter differentiation maintained. No acute or  chronic intracranial blood products. No mass lesion, midline shift or mass effect. No hydrocephalus or extra-axial fluid collection. Pituitary gland suprasellar region within normal limits. No abnormal enhancement. Vascular: Major intracranial vascular flow voids are maintained. Skull and upper cervical spine: Cranial junction little limits. Bone marrow signal intensity normal. No scalp soft tissue abnormality. Other: Mastoid air cells are clear. MRI ORBITS FINDINGS Orbits: Examination degraded by motion artifact. Additionally, assessment somewhat limited as the axial postcontrast T1 sequence is not fat saturated, limiting assessment of the optic nerves. Globes are symmetric in size with normal appearance  and morphology. There is asymmetric T2 signal intensity seen involving the left optic nerve (series 21, image 11). Patchy post-contrast enhancement seen within this region (series 26, image 12). Finding suggestive of acute left optic neuritis. No convincing changes of acute optic neuritis about the contralateral right optic nerve. Intraconal and extraconal fat maintained. Lacrimal glands normal. Extra-ocular muscles symmetric and normal. No abnormality about the orbital apices or cavernous sinus. Visualized sinuses: Clear. Soft tissues: Unremarkable. IMPRESSION: MRI HEAD: Normal brain MRI. No acute intracranial abnormality. MRI ORBITS: 1. Motion degraded exam. 2. Asymmetric T2 signal intensity and enhancement involving the left optic nerve, suggestive of acute optic neuritis. Electronically Signed   By: Morene Hoard M.D.   On: 06/07/2023 21:19    Procedures Procedures    Medications Ordered in ED Medications  gadobutrol  (GADAVIST ) 1 MMOL/ML injection 6 mL (6 mLs Intravenous Contrast Given 06/07/23 2039)    ED Course/ Medical Decision Making/ A&P                                 Medical Decision Making Amount and/or Complexity of Data Reviewed Labs: ordered. Decision-making details documented in ED Course. Radiology: ordered and independent interpretation performed. Decision-making details documented in ED Course.  Risk Prescription drug management.   Pt with medical problems and comorbidities and presenting today with a complaint that caries a high risk for morbidity and mortality.  Here with changes in vision since November per the patient but saw ophthalmology today and was noted to have abnormal findings in the left eye concerning for possible recurrent optic neuritis.  Patient was sent here for MRI of the brain and orbits.  Patient is stable at this time.  Denies any other areas of abnormality such as weakness or numbness to hands or feet.  She does intermittently get headaches but  denies having a headache right now.  No clumsiness or speech issues.  MRIs are pending. I have independently visualized and interpreted pt's images today.  MRI per radiology reports asymmetric T2 signal intensity and enhancement involving the left optic nerve suggestive of acute optic neuritis.  Discussed these results with the patient.  She reports she would like to leave now and follow-up as an outpatient.  Discussed with her that I cannot talk with neurology to see the recommendation and they would potentially want her to stay for IV steroids.  Patient states she is not interested in getting IV steroids because the side effects are terrible and she does not like it at all.  She reports she does not even want to take oral steroids because it makes her feel so badly.  I discussed with her that her vision could get worse and she could lose her vision completely.  I also discussed with her it most likely will not get better and less she treats it.  She reports that  she understands this but she wants to follow-up as an outpatient.  She does not desire to stay at the hospital any longer.  She does know that she is free to return if her symptoms get worse.  An outpatient referral was made for neurology.  I independently interpreted patient's labs and CBC, CMP without acute findings.        Final Clinical Impression(s) / ED Diagnoses Final diagnoses:  Optic neuritis, left  Left optic neuritis    Rx / DC Orders ED Discharge Orders          Ordered    Ambulatory referral to Neurology       Comments: An appointment is requested in approximately: 1 week Optic neuritis   06/07/23 2137              Doretha Folks, MD 06/07/23 2138    Doretha Folks, MD 06/07/23 2138

## 2023-06-07 NOTE — ED Provider Triage Note (Signed)
 Emergency Medicine Provider Triage Evaluation Note  Terri Taylor , a 33 y.o. female  was evaluated in triage.  Pt complains of blurred vision in left eye since November.  Sent by Eye doc for MRI of left eye for possible optic neuritis.  Patient provided paperwork that was uploaded into her chart regarding ED evaluation for MRI of orbits and brain.  Review of Systems  Positive: Blurred vision in left eye Negative: Fever, neck pain  Physical Exam  BP (!) 136/94   Pulse 86   Temp 98.5 F (36.9 C) (Oral)   Resp 16   Ht 5' 5 (1.651 m)   Wt 63 kg   SpO2 99%   BMI 23.11 kg/m  Gen:   Awake, no distress   Resp:  Normal effort  MSK:   Moves extremities without difficulty  Other:    Medical Decision Making  Medically screening exam initiated at 3:42 PM.  Appropriate orders placed.  Sabiha Sura was informed that the remainder of the evaluation will be completed by another provider, this initial triage assessment does not replace that evaluation, and the importance of remaining in the ED until their evaluation is complete.  MRI ordered   Minnie Tinnie BRAVO, PA 06/07/23 2215

## 2023-06-07 NOTE — Discharge Instructions (Addendum)
 If your vision gets worse or you change your mind return to the hospital for evaluation by neurology and admission for steroids.  Call your ophthalmologist tomorrow and let them know you got the MRI done and it does show optic neuritis.  May there is some way they can treated as an outpatient.  Somebody from the neurology office should call you for a follow-up.

## 2023-06-07 NOTE — ED Notes (Signed)
 Pt in MRI.

## 2023-06-07 NOTE — ED Triage Notes (Signed)
 Pt sent from [facility] for MRI. States they are worried about optic neurits due to blurred vision in right eye. Vision trouble since November.

## 2023-06-27 ENCOUNTER — Inpatient Hospital Stay (HOSPITAL_COMMUNITY)
Admission: EM | Admit: 2023-06-27 | Discharge: 2023-07-01 | DRG: 123 | Disposition: A | Discharge status: Home / Self Care | Payer: Medicaid Other | Attending: Internal Medicine | Admitting: Internal Medicine

## 2023-06-27 ENCOUNTER — Encounter (HOSPITAL_COMMUNITY): Payer: Self-pay | Admitting: Emergency Medicine

## 2023-06-27 DIAGNOSIS — H469 Unspecified optic neuritis: Secondary | ICD-10-CM | POA: Diagnosis not present

## 2023-06-27 DIAGNOSIS — M25551 Pain in right hip: Secondary | ICD-10-CM | POA: Diagnosis present

## 2023-06-27 DIAGNOSIS — Z881 Allergy status to other antibiotic agents status: Secondary | ICD-10-CM

## 2023-06-27 DIAGNOSIS — M25552 Pain in left hip: Secondary | ICD-10-CM | POA: Diagnosis present

## 2023-06-27 DIAGNOSIS — Z79899 Other long term (current) drug therapy: Secondary | ICD-10-CM

## 2023-06-27 DIAGNOSIS — Z888 Allergy status to other drugs, medicaments and biological substances status: Secondary | ICD-10-CM

## 2023-06-27 DIAGNOSIS — Z833 Family history of diabetes mellitus: Secondary | ICD-10-CM | POA: Diagnosis not present

## 2023-06-27 LAB — CBC
HCT: 36.4 % (ref 36.0–46.0)
HCT: 33.6 % — ABNORMAL LOW (ref 36.0–46.0)
Hemoglobin: 12.5 g/dL (ref 12.0–15.0)
Hemoglobin: 11.8 g/dL — ABNORMAL LOW (ref 12.0–15.0)
MCH: 31.2 pg (ref 26.0–34.0)
MCH: 31.6 pg (ref 26.0–34.0)
MCHC: 34.3 g/dL (ref 30.0–36.0)
MCHC: 35.1 g/dL (ref 30.0–36.0)
MCV: 90.8 fL (ref 80.0–100.0)
MCV: 89.8 fL (ref 80.0–100.0)
Platelets: 327 K/uL (ref 150–400)
Platelets: 313 10*3/uL (ref 150–400)
RBC: 4.01 MIL/uL (ref 3.87–5.11)
RBC: 3.74 MIL/uL — ABNORMAL LOW (ref 3.87–5.11)
RDW: 12.8 % (ref 11.5–15.5)
RDW: 12.9 % (ref 11.5–15.5)
WBC: 6.7 K/uL (ref 4.0–10.5)
WBC: 5.1 10*3/uL (ref 4.0–10.5)
nRBC: 0 % (ref 0.0–0.2)
nRBC: 0 % (ref 0.0–0.2)

## 2023-06-27 LAB — SEDIMENTATION RATE: Sed Rate: 10 mm/h (ref 0–22)

## 2023-06-27 LAB — HEPATIC FUNCTION PANEL
ALT: 18 U/L (ref 0–44)
AST: 16 U/L (ref 15–41)
Albumin: 4.3 g/dL (ref 3.5–5.0)
Alkaline Phosphatase: 43 U/L (ref 38–126)
Bilirubin, Direct: <0.1 mg/dL (ref 0.0–0.2)
Total Bilirubin: 0.8 mg/dL (ref 0.0–1.2)
Total Protein: 7.4 g/dL (ref 6.5–8.1)

## 2023-06-27 LAB — FOLATE: Folate: 23.5 ng/mL (ref >5.9)

## 2023-06-27 LAB — BASIC METABOLIC PANEL WITH GFR
Anion gap: 12 (ref 5–15)
BUN: 17 mg/dL (ref 6–20)
CO2: 22 mmol/L (ref 22–32)
Calcium: 9.5 mg/dL (ref 8.9–10.3)
Chloride: 103 mmol/L (ref 98–111)
Creatinine, Ser: 0.43 mg/dL — ABNORMAL LOW (ref 0.44–1.00)
GFR, Estimated: >60 mL/min
Glucose, Bld: 86 mg/dL (ref 70–99)
Potassium: 3.6 mmol/L (ref 3.5–5.1)
Sodium: 137 mmol/L (ref 135–145)

## 2023-06-27 LAB — TSH: TSH: 0.354 u[IU]/mL (ref 0.350–4.500)

## 2023-06-27 LAB — CREATININE, SERUM
Creatinine, Ser: 0.49 mg/dL (ref 0.44–1.00)
GFR, Estimated: >60 mL/min (ref >60)

## 2023-06-27 LAB — VITAMIN B12: Vitamin B-12: 643 pg/mL (ref 180–914)

## 2023-06-27 LAB — C-REACTIVE PROTEIN: CRP: 0.5 mg/dL (ref <1.0)

## 2023-06-27 LAB — HCG, SERUM, QUALITATIVE: Preg, Serum: NEGATIVE

## 2023-06-27 MED ORDER — ENOXAPARIN SODIUM 40 MG/0.4ML IJ SOSY
40.0000 mg | PREFILLED_SYRINGE | INTRAMUSCULAR | Status: DC
Start: 2023-06-28 — End: 2023-07-01
  Filled 2023-06-27 (×3): qty 0.4

## 2023-06-27 MED ORDER — ACETAMINOPHEN 650 MG RE SUPP: 650.0000 mg | Freq: Four times a day (QID) | RECTAL | Status: DC | PRN

## 2023-06-27 MED ORDER — SODIUM CHLORIDE 0.9 % IV SOLN
1000.0000 mg | Freq: Every day | INTRAVENOUS | Status: AC
  Administered 2023-06-27 – 2023-06-29 (×3): 1000 mg via INTRAVENOUS
  Filled 2023-06-27 (×3): qty 16

## 2023-06-27 MED ORDER — POLYETHYLENE GLYCOL 3350 17 G PO PACK: 17.0000 g | PACK | Freq: Every day | ORAL | Status: DC | PRN

## 2023-06-27 MED ORDER — PANTOPRAZOLE SODIUM 40 MG PO TBEC
40.0000 mg | DELAYED_RELEASE_TABLET | Freq: Every day | ORAL | Status: DC
  Filled 2023-06-27 (×3): qty 1

## 2023-06-27 MED ORDER — SODIUM CHLORIDE 0.9% FLUSH
3.0000 mL | Freq: Two times a day (BID) | INTRAVENOUS | Status: DC
  Administered 2023-06-27 – 2023-07-01 (×8): 3 mL via INTRAVENOUS

## 2023-06-27 MED ORDER — ACETAMINOPHEN 325 MG PO TABS
650.0000 mg | ORAL_TABLET | Freq: Four times a day (QID) | ORAL | Status: DC | PRN
  Administered 2023-06-28 (×2): 650 mg via ORAL
  Filled 2023-06-27 (×2): qty 2

## 2023-06-27 NOTE — H&P (Addendum)
History and Physical    Patient: Terri Taylor ZOX:[id number] DOB: [birth year] DOA: 06/27/2023 DOS: the patient was seen and examined on 06/27/2023 PCP: Mendel Ryder, PA-C  Patient coming from: Home  Chief Complaint:  Chief Complaint  Patient presents with   Eye Problem   HPI: Terri Taylor is a 33 y.o. female with medical history significant of known left eye ?optic neuritis in  2020.  Patient was actually admitted to the [facility] health system.  At that time patient had received Solu-Medrol 1 g daily.  MRI of the brain thoracic and cervical spine did not show any demyelinating lesions at the time.  My understanding is that her follow-up was with neurology.  However patient kept on getting better and therefore patient was discharged from the clinic in late 2021.  Patient seems to be have been asymptomatic since then.  Patient now reports that approximately in November last year 2024 she had recurrence of left eye blurry vision, sensation of foreign body and left-sided headache.  Patient actually had multiple appointments with ophthalmology culminating in appointment in January 2025.  At which time ophthalmology suspected that the patient may have a flare of optic neuritis.  And patient was referred to [facility] long ER on January 8.  An MRI of the orbits on January 8 showed T2 signal intensity and enhancement involving the left optic nerve.  Suggestive of acute optic neuritis.  Unfortunately on June 07, 2023, patient had discussion with her ER provider and decided to not use steroids or IV steroids.  And left the ER without opportunity for neurology to meet her.  Patient now returns to the ER today.  Per my discussion with the patient, her overall symptoms have been stable/improved since January 8.  Patient reports only mild discomfort while moving the left eye and some blurriness of vision.  However does not report any marked worsening since January 8.  My understanding is that patient has returned  to complete/pursue treatment previously offered. Accompanied by her parents this evening.  Patient does not report any other neurologic signs such as contralateral vision blurring, palsy or double vision hearing disturbance swallowing disturbance focal motor weakness etc.    Review of Systems: As mentioned in the history of present illness. All other systems reviewed and are negative. Past Medical History:  Diagnosis Date   Bladder pain    Eczema    Frequency of urination    Interstitial cystitis    Nocturia    Optic neuritis, left    Urgency of urination    Wears contact lenses    Past Surgical History:  Procedure Laterality Date   CYSTO WITH HYDRODISTENSION N/A 10/21/2014   Procedure: CYSTOSCOPY/HYDRODISTENSION MARCAINE AND PYRIDIUM ;  Surgeon: Bjorn Pippin, MD;  Location: [facility];  Service: Urology;  Laterality: N/A;   Social History:  reports that she has never smoked. She has never used smokeless tobacco. She reports that she does not drink alcohol and does not use drugs.  Allergies  Allergen Reactions   Ciprofloxacin    Compazine [Prochlorperazine] Other (See Comments)    Pt states she was given this through her IV during an ER visit back in 2015 for a migraine and it caused restlessness and couldn't breath. States she had to come back and was given benadryl.      Family History  Problem Relation Age of Onset   Healthy Mother    Healthy Father    Diabetes Maternal Grandfather     Prior to  Admission medications   Medication Sig Start Date End Date Taking? Authorizing Provider  acetaminophen (TYLENOL) 500 MG tablet Take 500 mg by mouth every 6 (six) hours as needed for mild pain or moderate pain.    [provider]  betamethasone dipropionate (DIPROLENE) 0.05 % cream Apply 1-2 application topically daily. Use for 14 days 01/09/19   [provider]  diphenhydrAMINE (BENADRYL) 25 MG tablet Take 12.5 mg by mouth every 6 (six) hours as  needed for sleep.    [provider]  ibuprofen (ADVIL) 200 MG tablet Take 600 mg by mouth every 6 (six) hours as needed for mild pain or moderate pain.    [provider]  Melatonin 1 MG/ML LIQD Take 1 mg by mouth at bedtime as needed (sleep).    [provider]  Multiple Vitamins-Minerals (MULTIVITAMIN ADULT PO) Take 1 tablet by mouth daily.    [provider]  solifenacin (VESICARE) 10 MG tablet Take 10 mg by mouth daily. 01/25/19   [provider]    Physical Exam: Vitals:   06/27/23 1435  BP: (!) 127/90  Pulse: 95  Resp: 16  Temp: 98.6 F (37 C)  TempSrc: Oral  SpO2: 100%   General: Patient is alert awake oriented x 3 Gazes are preserved in all direction PPL pupils are mid dilated, sluggish constriction to like challenge.  However with correction, there is no visual field defects on confrontation testing.  And patient is able to read at least 1/4 inch tall print with either eye without any problem Respiratory; bilateral intravesicular Cardiovascular exam S1-S2 normal Abdomen all quadrant soft nontender Extremities warm without edema no focal motor deficit Left : On direct funduscopy I did not appreciate any blurring of the optic disc margins. Data Reviewed:  Labs on Admission:  Results for orders placed or performed during the hospital encounter of 06/27/23 (from the past 24 hours)  CBC     Status: None   Collection Time: 06/27/23  5:01 PM  Result Value Ref Range   WBC 6.7 4.0 - 10.5 K/uL   RBC 4.01 3.87 - 5.11 MIL/uL   Hemoglobin 12.5 12.0 - 15.0 g/dL   HCT 11.9 14.7 - 82.9 %   MCV 90.8 80.0 - 100.0 fL   MCH 31.2 26.0 - 34.0 pg   MCHC 34.3 30.0 - 36.0 g/dL   RDW 56.2 13.0 - 86.5 %   Platelets 327 150 - 400 K/uL   nRBC 0.0 0.0 - 0.2 %  Basic metabolic panel     Status: Abnormal   Collection Time: 06/27/23  5:01 PM  Result Value Ref Range   Sodium 137 135 - 145 mmol/L   Potassium 3.6 3.5 - 5.1 mmol/L   Chloride 103 98 -  111 mmol/L   CO2 22 22 - 32 mmol/L   Glucose, Bld 86 70 - 99 mg/dL   BUN 17 6 - 20 mg/dL   Creatinine, Ser 7.84 (L) 0.44 - 1.00 mg/dL   Calcium 9.5 8.9 - 69.6 mg/dL   GFR, Estimated >29 >52 mL/min   Anion gap 12 5 - 15   Basic Metabolic Panel: Recent Labs  Lab 06/27/23 1701  NA 137  K 3.6  CL 103  CO2 22  GLUCOSE 86  BUN 17  CREATININE 0.43*  CALCIUM 9.5   Liver Function Tests: No results for input(s): "AST", "ALT", "ALKPHOS", "BILITOT", "PROT", "ALBUMIN" in the last 168 hours. No results for input(s): "LIPASE", "AMYLASE" in the last 168 hours. No  results for input(s): "AMMONIA" in the last 168 hours. CBC: Recent Labs  Lab 06/27/23 1701  WBC 6.7  HGB 12.5  HCT 36.4  MCV 90.8  PLT 327   Cardiac Enzymes: No results for input(s): "CKTOTAL", "CKMB", "CKMBINDEX", "TROPONINIHS" in the last 168 hours.  BNP (last 3 results) No results for input(s): "PROBNP" in the last 8760 hours. CBG: No results for input(s): "GLUCAP" in the last 168 hours.  Radiological Exams on Admission:  Narrative & Impression  CLINICAL DATA:  Optic neuritis.  Blurred vision for 1 week.   EXAM: MRI HEAD AND ORBITS WITHOUT AND WITH CONTRAST   TECHNIQUE: Multiplanar, multiecho pulse sequences of the brain and surrounding structures were obtained without and with intravenous contrast. Multiplanar, multiecho pulse sequences of the orbits and surrounding structures were obtained including fat saturation techniques, before and after intravenous contrast administration.   CONTRAST:  5mL GADAVIST GADOBUTROL 1 MMOL/ML IV SOLN   COMPARISON:  None.   FINDINGS: MRI HEAD FINDINGS   Brain: There is no evidence of acute infarct, intracranial hemorrhage, mass, midline shift, or extra-axial fluid collection. The ventricles and sulci are normal. The brain is normal in signal. No abnormal enhancement is identified.   Vascular: Major intracranial vascular flow voids are preserved.   Skull and upper  cervical spine: Unremarkable bone marrow signal.   Other: None.   MRI ORBITS FINDINGS   Orbits: Mild motion artifact and incomplete fat suppression primarily in the left orbit limit assessment. However, there is still the suggestion of true abnormal T2 hyperintensity and enhancement associated with the intraorbital and canalicular segments of the left optic nerve with the left optic nerve also appearing mildly enlarged. The right optic nerve is normal in appearance. No orbital mass is identified. The globes appear intact. The extraocular muscles and lacrimal glands are symmetric and normal in appearance.   Visualized sinuses: Minimal mucosal thickening in the paranasal sinuses. Clear mastoid air cells.   Soft tissues: Unremarkable.   Limited intracranial: Unremarkable appearance of the optic chiasm and pituitary gland.   IMPRESSION: 1. Findings suspicious for left optic neuritis. 2. Unremarkable appearance of the brain.     Electronically Signed   By: Sebastian Ache M.D.   On: 02/17/2019 14:00    Narrative & Impression  CLINICAL DATA:  Prior study from 02/17/2019.   EXAM: MRI HEAD AND ORBITS WITHOUT AND WITH CONTRAST   TECHNIQUE: Multiplanar, multiecho pulse sequences of the brain and surrounding structures were obtained without and with intravenous contrast. Multiplanar, multiecho pulse sequences of the orbits and surrounding structures were obtained including fat saturation techniques, before and after intravenous contrast administration.   CONTRAST:  6mL GADAVIST GADOBUTROL 1 MMOL/ML IV SOLN   COMPARISON:  None Available.   FINDINGS: MRI HEAD FINDINGS   Brain: Examination degraded by motion artifact.   Cerebral volume within normal limits. No focal parenchymal signal abnormality. No evidence for acute or subacute ischemia. Gray-white matter differentiation maintained. No acute or chronic intracranial blood products.   No mass lesion, midline shift or mass  effect. No hydrocephalus or extra-axial fluid collection. Pituitary gland suprasellar region within normal limits. No abnormal enhancement.   Vascular: Major intracranial vascular flow voids are maintained.   Skull and upper cervical spine: Cranial junction little limits. Bone marrow signal intensity normal. No scalp soft tissue abnormality.   Other: Mastoid air cells are clear.   MRI ORBITS FINDINGS   Orbits: Examination degraded by motion artifact. Additionally, assessment somewhat limited as the axial postcontrast T1  sequence is not fat saturated, limiting assessment of the optic nerves.   Globes are symmetric in size with normal appearance and morphology. There is asymmetric T2 signal intensity seen involving the left optic nerve (series 21, image 11). Patchy post-contrast enhancement seen within this region (series 26, image 12). Finding suggestive of acute left optic neuritis. No convincing changes of acute optic neuritis about the contralateral right optic nerve. Intraconal and extraconal fat maintained. Lacrimal glands normal. Extra-ocular muscles symmetric and normal. No abnormality about the orbital apices or cavernous sinus.   Visualized sinuses: Clear.   Soft tissues: Unremarkable.   IMPRESSION: MRI HEAD:   Normal brain MRI. No acute intracranial abnormality.   MRI ORBITS:   1. Motion degraded exam. 2. Asymmetric T2 signal intensity and enhancement involving the left optic nerve, suggestive of acute optic neuritis.     Electronically Signed   By: Rise Mu M.D.   On: 06/07/2023 21:19      Assessment and Plan: * Optic neuritis First episode in 2020, confirmed by MRI, as above.  S/p IV prednisone/steroids at the time with some adverse effects to patient.  Now comes in with complaints of blurry vision and left-sided headache since November.  Repeat MRI done on June 07, 2023 confirms rec relapsed/recurrent episode of left optic neuritis.   Patient left AMA at the time.  Patient reports stable symptoms since then.  Visual fields and acuity grossly comparable/acceptable since then.  MRI does not show other demyelinating lesions in the brain.  This makes MS less likely.  Check RPR, HIV, B12, folate, Lyme disease serology with reflex, NMO antibody, TSH, CRP and sed rate.  Given more than 8 days since symptom onset, stable vision reported by patient.  Not sure if patient will benefit from systemic IV steroids at this stage.  Look forward to neurology evaluation that has been sought by ER provider.  MOGAD ab testing send out ordred.   Nurse visual acuity screening requested to document baseline/progression etc.  HCG pending.  DVT ppx with lovenox. Patinet does not take any meds at home. Please review after pharmcy input.  Advance Care Planning:   Code Status: Full Code   Consults: neurology inptu will be appreciated.  Family Communication: mom and dad at bedside. Discussion held.  Severity of Illness: The appropriate patient status for this patient is INPATIENT. Inpatient status is judged to be reasonable and necessary in order to provide the required intensity of service to ensure the patient's safety. The patient's presenting symptoms, physical exam findings, and initial radiographic and laboratory data in the context of their chronic comorbidities is felt to place them at high risk for further clinical deterioration. Furthermore, it is not anticipated that the patient will be medically stable for discharge from the hospital within 2 midnights of admission.   * I certify that at the point of admission it is my clinical judgment that the patient will require inpatient hospital care spanning beyond 2 midnights from the point of admission due to high intensity of service, high risk for further deterioration and high frequency of surveillance required.*  Author: Nolberto Hanlon, MD 06/27/2023 6:07 PM  For on call review [email or web address].

## 2023-06-27 NOTE — ED Triage Notes (Signed)
Pt back to the Ed for left eye pain and blurred vision , pt had an MRI earlier this month that showed Optic Neurits, pt left ama prior to treatment

## 2023-06-27 NOTE — ED Provider Notes (Signed)
[place] EMERGENCY DEPARTMENT AT [facility] Provider Note   CSN: [id number] Arrival date & time: 06/27/23  1421     History  Chief Complaint  Patient presents with   Eye Problem    Terri Taylor is a 33 y.o. female with past medical history significant for optic neuritis treated in 2020 who presents with concern for ongoing left eye pain, blurry vision.  MRI performed earlier this month which showed optic neuritis.  Recommendation by neurology at that time treated with high-dose steroids the patient left AMA.  She did not follow-up with an ophthalmologist.  She reports ongoing symptoms with referral vision loss, pain with extraocular movements.  She reports no significant worsening of symptoms.  She denies any numbness, tingling, difficulty with coordination, weakness of extremities.  She reports that she would stay for treatment as recommended today.   Eye Problem      Home Medications Prior to Admission medications   Medication Sig Start Date End Date Taking? Authorizing Provider  acetaminophen (TYLENOL) 500 MG tablet Take 500 mg by mouth every 6 (six) hours as needed for mild pain or moderate pain.    [provider]  betamethasone dipropionate (DIPROLENE) 0.05 % cream Apply 1-2 application topically daily. Use for 14 days 01/09/19   [provider]  diphenhydrAMINE (BENADRYL) 25 MG tablet Take 12.5 mg by mouth every 6 (six) hours as needed for sleep.    [provider]  ibuprofen (ADVIL) 200 MG tablet Take 600 mg by mouth every 6 (six) hours as needed for mild pain or moderate pain.    [provider]  Melatonin 1 MG/ML LIQD Take 1 mg by mouth at bedtime as needed (sleep).    [provider]  Multiple Vitamins-Minerals (MULTIVITAMIN ADULT PO) Take 1 tablet by mouth daily.    [provider]  solifenacin (VESICARE) 10 MG tablet Take 10 mg by mouth daily. 01/25/19   [provider]      Allergies     Ciprofloxacin and Compazine [prochlorperazine]    Review of Systems   Review of Systems  Eyes:  Positive for pain and visual disturbance.  All other systems reviewed and are negative.   Physical Exam Updated Vital Signs BP (!) 127/90 (BP Location: Right Arm)   Pulse 95   Temp 98.6 F (37 C) (Oral)   Resp 16   SpO2 100%  Physical Exam Vitals and nursing note reviewed.  Constitutional:      General: She is not in acute distress.    Appearance: Normal appearance.  HENT:     Head: Normocephalic and atraumatic.  Eyes:     General:        Right eye: No discharge.        Left eye: No discharge.  Cardiovascular:     Rate and Rhythm: Normal rate and regular rhythm.     Heart sounds: No murmur heard.    No friction rub. No gallop.  Pulmonary:     Effort: Pulmonary effort is normal.     Breath sounds: Normal breath sounds.  Abdominal:     General: Bowel sounds are normal.     Palpations: Abdomen is soft.  Skin:    General: Skin is warm and dry.     Capillary Refill: Capillary refill takes less than 2 seconds.  Neurological:     Mental Status: She is alert and oriented to person, place, and time.     Comments: Cranial nerves II through XII  grossly intact.  Intact finger-nose, intact heel-to-shin.  Romberg negative, gait normal.  Alert and oriented x3.  Moves all 4 limbs spontaneously, normal coordination.  No pronator drift.  Intact strength 5 out of 5 bilateral upper and lower extremities.  Endorses visual field deficit on left but does no visual field cut. EOMs intact. PERRLA  Psychiatric:        Mood and Affect: Mood normal.        Behavior: Behavior normal.     ED Results / Procedures / Treatments   Labs (all labs ordered are listed, but only abnormal results are displayed) Labs Reviewed  CBC  BASIC METABOLIC PANEL    EKG None  Radiology No results found.  Procedures Procedures    Medications Ordered in ED Medications  methylPREDNISolone sodium  succinate (SOLU-MEDROL) 1,000 mg in sodium chloride 0.9 % 50 mL IVPB (has no administration in time range)    ED Course/ Medical Decision Making/ A&P                                 Medical Decision Making Amount and/or Complexity of Data Reviewed Labs: ordered.   This patient is a 33 y.o. female  who presents to the ED for concern of vision changes.   Differential diagnoses prior to evaluation: The emergent differential diagnosis includes, but is not limited to, high clinical suspicion for optic neuritis given previous history of same but considered orbital cellulitis, periorbital cellulitis, glaucoma, abrasion of cornea, retinal detachment, CRAO, versus other. This is not an exhaustive differential.   Past Medical History / Co-morbidities / Social History: Optic neuritis  Additional history: Chart reviewed. Pertinent results include: Reviewed MR of the orbits and brain with and without contrast performed 20 days ago showing active optic neuritis.  Reviewed remote MR of the cervical and thoracic spine showing no evidence of other lesions previously.    Physical Exam: Physical exam performed. The pertinent findings include: Decreased visual acuity on the left, eye otherwise appears unremarkable, EOMs intact.  No other focal neurologic deficits noted.  Vital signs stable other than mild diastolic hypertension, blood pressure 127/90.   Lab Tests/Imaging studies: I personally interpreted labs/imaging and the pertinent results include:   CBC unremarkable, BMP unremarkable.   Medications: I ordered medication including 1 g Solu-Medrol for acute optic neuritis.  I have reviewed the patients home medicines and have made adjustments as needed.   Consults: I spoke with the neurologist, Dr. Selina Taylor who after reviewing patient's previous evaluation agrees to admission for acute optic neuritis, high-dose steroids.  She does not recommend repeat MR at this time, I spoke with the hospitalist, Dr.  Phoebe Taylor who agrees to admission for acute optic neuritis.   Final Clinical Impression(s) / ED Diagnoses Final diagnoses:  None    Rx / DC Orders ED Discharge Orders     None         [place] 06/27/23 1638    Terri Plan, MD 06/30/23 1547

## 2023-06-27 NOTE — Plan of Care (Signed)

## 2023-06-27 NOTE — Assessment & Plan Note (Addendum)
First episode in 2020, confirmed by MRI, as above.  S/p IV prednisone/steroids at the time with some adverse effects to patient.  Now comes in with complaints of blurry vision and left-sided headache since November.  Repeat MRI done on June 07, 2023 confirms rec relapsed/recurrent episode of left optic neuritis.  Patient left AMA at the time.  Patient reports stable symptoms since then.  Visual fields and acuity grossly comparable/acceptable since then.  MRI does not show other demyelinating lesions in the brain.  This makes MS less likely.  Check RPR, HIV, B12, folate, Lyme disease serology with reflex, NMO antibody, TSH, CRP and sed rate.  Given more than 8 days since symptom onset, stable vision reported by patient.  Not sure if patient will benefit from systemic IV steroids at this stage.  Look forward to neurology evaluation that has been sought by ER provider.  MOGAD ab testing send out ordred.

## 2023-06-27 NOTE — Consult Note (Addendum)
NEUROLOGY CONSULT NOTE   Date of service: June 27, 2023 Patient Name: Terri Taylor MRN:  [id number] DOB:  June 12, 1990 Chief Complaint: "L optic neuritis" Requesting Provider: Nolberto Hanlon, MD  History of Present Illness  Terri Taylor is a 33 y.o. female with hx of L optic neuritis treated with IV solumedrol and improved who presents with recurrent L eye pain, blurred vision and MRI Brain and orbits concerning for recurrence of L optic neuritis.  She has had blurred vision and pain since nov of 2024. Was able to see ophthalmology and initially presented to the ED on 06/07/23 with MRI Brain and orbit demonstrating L eye optic neuritis. She was recommended treatment with IV solumedrol but left AMA. She came back to the hospital today to pursue treatment.  She was of the left eye vision is gradually improving.  She describes her vision currently in the left eye as about 90% of her baseline.    ROS  Comprehensive ROS performed and pertinent positives documented in HPI   Past History   Past Medical History:  Diagnosis Date   Bladder pain    Eczema    Frequency of urination    Interstitial cystitis    Nocturia    Optic neuritis, left    Urgency of urination    Wears contact lenses     Past Surgical History:  Procedure Laterality Date   CYSTO WITH HYDRODISTENSION N/A 10/21/2014   Procedure: CYSTOSCOPY/HYDRODISTENSION MARCAINE AND PYRIDIUM ;  Surgeon: Bjorn Pippin, MD;  Location: [facility];  Service: Urology;  Laterality: N/A;    Family History: Family History  Problem Relation Age of Onset   Healthy Mother    Healthy Father    Diabetes Maternal Grandfather     Social History  reports that she has never smoked. She has never used smokeless tobacco. She reports that she does not drink alcohol and does not use drugs.  Allergies  Allergen Reactions   Ciprofloxacin    Compazine [Prochlorperazine] Other (See Comments)    Pt states she was given this through her  IV during an ER visit back in 2015 for a migraine and it caused restlessness and couldn't breath. States she had to come back and was given benadryl.      Medications   Current Facility-Administered Medications:    acetaminophen (TYLENOL) tablet 650 mg, 650 mg, Oral, Q6H PRN **OR** acetaminophen (TYLENOL) suppository 650 mg, 650 mg, Rectal, Q6H PRN, Nolberto Hanlon, MD   Melene Muller ON 06/28/2023] enoxaparin (LOVENOX) injection 40 mg, 40 mg, Subcutaneous, Q24H, Nolberto Hanlon, MD   methylPREDNISolone sodium succinate (SOLU-MEDROL) 1,000 mg in sodium chloride 0.9 % 50 mL IVPB, 1,000 mg, Intravenous, Daily, Nolberto Hanlon, MD, Stopped at 06/27/23 1957   polyethylene glycol (MIRALAX / GLYCOLAX) packet 17 g, 17 g, Oral, Daily PRN, Nolberto Hanlon, MD   sodium chloride flush (NS) 0.9 % injection 3 mL, 3 mL, Intravenous, Q12H, Nolberto Hanlon, MD  Vitals   Vitals:   06/27/23 1435 06/27/23 1900 06/27/23 1915  BP: (!) 127/90  (!) 136/93  Pulse: 95  90  Resp: 16 18   Temp: 98.6 F (37 C) 98.6 F (37 C)   TempSrc: Oral Oral   SpO2: 100%  100%    There is no height or weight on file to calculate BMI.  Physical Exam   General: Laying comfortably in bed; in no acute distress.  HENT: Normal oropharynx and mucosa. Normal external appearance of ears and nose.  Neck: Supple, no pain  or tenderness  CV: No JVD. No peripheral edema.  Pulmonary: Symmetric Chest rise. Normal respiratory effort.  Abdomen: Soft to touch, non-tender.  Ext: No cyanosis, edema, or deformity  Skin: No rash. Normal palpation of skin.   Musculoskeletal: Normal digits and nails by inspection. No clubbing.   Neurologic Examination  Mental status/Cognition: Alert, oriented to self, place, month and year, good attention.  Speech/language: Fluent, comprehension intact, object naming intact, repetition intact.  Cranial nerves:   CN II Pupils equal and reactive to light, no VF deficits    CN III,IV,VI EOM intact, no gaze preference or deviation, no  nystagmus    CN V normal sensation in V1, V2, and V3 segments bilaterally    CN VII no asymmetry, no nasolabial fold flattening    CN VIII normal hearing to speech    CN IX & X normal palatal elevation, no uvular deviation    CN XI 5/5 head turn and 5/5 shoulder shrug bilaterally    CN XII midline tongue protrusion    Motor:  Muscle bulk: normal, tone normal, pronator drift none tremor none Mvmt Root Nerve  Muscle Right Left Comments  SA C5/6 Ax Deltoid 5 5   EF C5/6 Mc Biceps 5 5   EE C6/7/8 Rad Triceps 5 5   WF C6/7 Med FCR     WE C7/8 PIN ECU     F Ab C8/T1 U ADM/FDI 5 5   HF L1/2/3 Fem Illopsoas 5 5   KE L2/3/4 Fem Quad 5 5   DF L4/5 D Peron Tib Ant 5 5   PF S1/2 Tibial Grc/Sol 5 5    Sensation:  Light touch Intact throughout   Pin prick    Temperature    Vibration   Proprioception    Coordination/Complex Motor:  - Finger to Nose intact bilaterally - Heel to shin and bilaterally - Rapid alternating movement are normal - Gait: Stride length normal. Arm swing normal. Base width narrow.   Labs/Imaging/Neurodiagnostic studies   CBC:  Recent Labs  Lab 07-03-2023 1701 July 03, 2023 1956  WBC 6.7 5.1  HGB 12.5 11.8*  HCT 36.4 33.6*  MCV 90.8 89.8  PLT 327 313   Basic Metabolic Panel:  Lab Results  Component Value Date   NA 137 2023-07-03   K 3.6 03-Jul-2023   CO2 22 2023/07/03   GLUCOSE 86 07-03-2023   BUN 17 2023-07-03   CREATININE 0.49 Jul 03, 2023   CALCIUM 9.5 July 03, 2023   GFRNONAA >60 07/03/23   GFRAA >60 02/17/2019   Lipid Panel: No results found for: "LDLCALC" HgbA1c: No results found for: "HGBA1C" Urine Drug Screen: No results found for: "LABOPIA", "COCAINSCRNUR", "LABBENZ", "AMPHETMU", "THCU", "LABBARB"  Alcohol Level No results found for: "ETH" INR No results found for: "INR" APTT No results found for: "APTT" AED levels: No results found for: "PHENYTOIN", "ZONISAMIDE", "LAMOTRIGINE", "LEVETIRACETA"  MR orbits with and without contrast(personally  reviewed): Asymmetric T2 signal intensity and enhancement involving the left optic nerve, suggestive of acute optic neuritis.  MRI Brain(Personally reviewed): Normal brain MRI. No acute intracranial abnormality.   ASSESSMENT   Terri Taylor is a 33 y.o. female with prior history of left optic neuritis with no other noted demyelinating lesions in the brain, presented with recurrent left optic neuritis.  RECOMMENDATIONS  - IV solumedrol daily x 5 doses. First dose was given last evening. PPI while on steroids. - NMO panel, MOG Ab, Lyme, RPR is pending. - follow up with Dr. Despina Arias with [facility] Neurology outpatient.  ______________________________________________________________________    Welton Flakes, MD [facility]

## 2023-06-27 NOTE — ED Notes (Signed)
[facility] here to get patient

## 2023-06-28 DIAGNOSIS — H469 Unspecified optic neuritis: Secondary | ICD-10-CM | POA: Diagnosis not present

## 2023-06-28 LAB — GLUCOSE, CAPILLARY: Glucose-Capillary: 145 mg/dL — ABNORMAL HIGH (ref 70–99)

## 2023-06-28 LAB — SYPHILIS: RPR W/REFLEX TO RPR TITER AND TREPONEMAL ANTIBODIES, TRADITIONAL SCREENING AND DIAGNOSIS ALGORITHM: RPR Ser Ql: NONREACTIVE

## 2023-06-28 LAB — RAPID URINE DRUG SCREEN, HOSP PERFORMED
Amphetamines: NOT DETECTED
Barbiturates: NOT DETECTED
Benzodiazepines: NOT DETECTED
Cocaine: NOT DETECTED
Opiates: NOT DETECTED
Tetrahydrocannabinol: NOT DETECTED

## 2023-06-28 LAB — HIV ANTIBODY (ROUTINE TESTING W REFLEX): HIV Screen 4th Generation wRfx: NONREACTIVE

## 2023-06-28 LAB — HEMOGLOBIN A1C
Hgb A1c MFr Bld: 5.1 % (ref 4.8–5.6)
Mean Plasma Glucose: 99.67 mg/dL

## 2023-06-28 MED ORDER — INSULIN ASPART 100 UNIT/ML IJ SOLN: 0.0000 [IU] | Freq: Three times a day (TID) | INTRAMUSCULAR | Status: DC

## 2023-06-28 NOTE — Progress Notes (Signed)
PROGRESS NOTE        PATIENT DETAILS Name: Terri Taylor Age: 33 y.o. Sex: female Date of Birth: March 16, 1991 Admit Date: 06/27/2023 Admitting Physician Nolberto Hanlon, MD ZOX:[id number], Gerilyn Pilgrim, PA-C  Brief Summary: Patient is a 33 y.o.  female with prior history of left optic neuritis-who presented with blurry vision-neuroimaging consistent with recurrence of left optic neuritis-admitted to [facility] and started on IV steroids on 1/28.  Significant events: 1/08>> ED visit-MRI imaging-consistent with left upper acute optic neuritis-declined hospitalization 1/28>> persistent blurry vision-admit to [facility]-IV steroids started 12/28  Significant studies: 1/8>> MRI brain: No acute intracranial abnormality 1/8>> MRI orbits: Acute optic neuritis  Significant microbiology data: None  Procedures: None  Consults: Neurology  Subjective: Some improvement in blurry vision overnight.  Objective: Vitals: Blood pressure 107/70, pulse 82, temperature 97.8 F (36.6 C), temperature source Oral, resp. rate 18, SpO2 99%.   Exam: Gen Exam:Alert awake-not in any distress HEENT:atraumatic, normocephalic Chest: B/L clear to auscultation anteriorly CVS:S1S2 regular Abdomen:soft non tender, non distended Extremities:no edema Neurology: Non focal Skin: no rash  Pertinent Labs/Radiology:    Latest Ref Rng & Units 06/27/2023    7:56 PM 06/27/2023    5:01 PM 06/07/2023    7:30 PM  CBC  WBC 4.0 - 10.5 K/uL 5.1  6.7  7.4   Hemoglobin 12.0 - 15.0 g/dL 54.0  98.1  19.1   Hematocrit 36.0 - 46.0 % 33.6  36.4  36.0   Platelets 150 - 400 K/uL 313  327  308     Lab Results  Component Value Date   NA 137 06/27/2023   K 3.6 06/27/2023   CL 103 06/27/2023   CO2 22 06/27/2023      Assessment/Plan: Left optic neuritis Some improvement overnight IV steroids x 5 days-started 1/28 Plan is to discharge-with follow-up with Dr. Epimenio Foot at [facility] as outpatient  BMI: Estimated body mass index is  23.11 kg/m as calculated from the following:   Height as of 06/07/23: 5\' 5"  (1.651 m).   Weight as of 06/07/23: 63 kg.   Code status:   Code Status: Full Code   DVT Prophylaxis: enoxaparin (LOVENOX) injection 40 mg Start: 06/28/23 0800 SCDs Start: 06/27/23 1800   Family Communication: None at bedside   Disposition Plan: Status is: Inpatient Remains inpatient appropriate because: Severity of illness   Planned Discharge Destination:Home   Diet: Diet Order             Diet regular Room service appropriate? Yes; Fluid consistency: Thin  Diet effective now                     Antimicrobial agents: Anti-infectives (From admission, onward)    None        MEDICATIONS: Scheduled Meds:  enoxaparin (LOVENOX) injection  40 mg Subcutaneous Q24H   pantoprazole  40 mg Oral Daily   sodium chloride flush  3 mL Intravenous Q12H   Continuous Infusions:  methylPREDNISolone (SOLU-MEDROL) injection Stopped (06/27/23 1957)   PRN Meds:.acetaminophen **OR** acetaminophen, polyethylene glycol   I have personally reviewed following labs and imaging studies  LABORATORY DATA: CBC: Recent Labs  Lab 06/27/23 1701 06/27/23 1956  WBC 6.7 5.1  HGB 12.5 11.8*  HCT 36.4 33.6*  MCV 90.8 89.8  PLT 327 313    Basic Metabolic Panel: Recent Labs  Lab 06/27/23 1701 06/27/23  1956  NA 137  --   K 3.6  --   CL 103  --   CO2 22  --   GLUCOSE 86  --   BUN 17  --   CREATININE 0.43* 0.49  CALCIUM 9.5  --     GFR: CrCl cannot be calculated (Unknown ideal weight.).  Liver Function Tests: Recent Labs  Lab 06/27/23 1956  AST 16  ALT 18  ALKPHOS 43  BILITOT 0.8  PROT 7.4  ALBUMIN 4.3   No results for input(s): "LIPASE", "AMYLASE" in the last 168 hours. No results for input(s): "AMMONIA" in the last 168 hours.  Coagulation Profile: No results for input(s): "INR", "PROTIME" in the last 168 hours.  Cardiac Enzymes: No results for input(s): "CKTOTAL", "CKMB",  "CKMBINDEX", "TROPONINI" in the last 168 hours.  BNP (last 3 results) No results for input(s): "PROBNP" in the last 8760 hours.  Lipid Profile: No results for input(s): "CHOL", "HDL", "LDLCALC", "TRIG", "CHOLHDL", "LDLDIRECT" in the last 72 hours.  Thyroid Function Tests: Recent Labs    06/27/23 1956  TSH 0.354    Anemia Panel: Recent Labs    06/27/23 1956  VITAMINB12 643  FOLATE 23.5    Urine analysis:    Component Value Date/Time   COLORURINE YELLOW 12/02/2011 2335   APPEARANCEUR CLEAR 12/02/2011 2335   LABSPEC 1.010 12/02/2011 2335   PHURINE 7.5 12/02/2011 2335   GLUCOSEU NEGATIVE 12/02/2011 2335   HGBUR TRACE (A) 12/02/2011 2335   BILIRUBINUR NEGATIVE 12/02/2011 2335   KETONESUR NEGATIVE 12/02/2011 2335   PROTEINUR NEGATIVE 12/02/2011 2335   UROBILINOGEN 2.0 (H) 12/02/2011 2335   NITRITE NEGATIVE 12/02/2011 2335   LEUKOCYTESUR NEGATIVE 12/02/2011 2335    Sepsis Labs: Lactic Acid, Venous No results found for: "LATICACIDVEN"  MICROBIOLOGY: No results found for this or any previous visit (from the past 240 hours).  RADIOLOGY STUDIES/RESULTS: No results found.   LOS: 1 day   Jeoffrey Massed, MD  [facility]    To contact the attending provider between 7A-7P or the covering provider during after hours 7P-7A, please log into the web site [email or web address] and access using universal [place] password for that web site. If you do not have the password, please call the hospital operator.  06/28/2023, 11:37 AM

## 2023-06-28 NOTE — TOC Initial Note (Signed)
Transition of Care [facility]) - Initial/Assessment Note    Patient Details  Name: Terri Taylor MRN: [id number] Date of Birth: 1990/09/19  Transition of Care [facility]) CM/SW Contact:    Kermit Balo, RN Phone Number: 06/28/2023, 2:05 PM  Clinical Narrative:                  Pt is from home alone. She says her parents can check on her.  She denies issues with transportation.  She wasn't taking any prescription medications prior to admission. No DME at home. TOC following for d/c needs.  Expected Discharge Plan: Home/Self Care Barriers to Discharge: Continued Medical Work up   Patient Goals and CMS Choice            Expected Discharge Plan and Services       Living arrangements for the past 2 months: Apartment                                      Prior Living Arrangements/Services Living arrangements for the past 2 months: Apartment Lives with:: Self Patient language and need for interpreter reviewed:: Yes Do you feel safe going back to the place where you live?: Yes            Criminal Activity/Legal Involvement Pertinent to Current Situation/Hospitalization: No - Comment as needed  Activities of Daily Living      Permission Sought/Granted                  Emotional Assessment Appearance:: Appears younger than stated age Attitude/Demeanor/Rapport: Engaged Affect (typically observed): Accepting Orientation: : Oriented to Self, Oriented to Place, Oriented to  Time, Oriented to Situation   Psych Involvement: No (comment)  Admission diagnosis:  Optic neuritis [H46.9] Patient Active Problem List   Diagnosis Date Noted   Optic neuritis 06/27/2023   Left optic neuritis 02/17/2019   PCP:  Mendel Ryder, PA-C Pharmacy:   [facility] [phone] [place] Otto, [place] - [address] AT [address]  [place] [address] Phone: [phone] Fax: [phone]     Social Drivers of Health (SDOH) Social History: SDOH Screenings    Food Insecurity: No Food Insecurity (06/28/2023)  Housing: Unknown (06/28/2023)  Transportation Needs: No Transportation Needs (06/28/2023)  Utilities: Not At Risk (06/28/2023)  Tobacco Use: Low Risk  (06/27/2023)   SDOH Interventions:     Readmission Risk Interventions     No data to display

## 2023-06-28 NOTE — Progress Notes (Signed)
Patient refused bedtime blood glucose check. RN educated patient on it's importance. Dr Janalyn Shy made aware.

## 2023-06-29 DIAGNOSIS — H469 Unspecified optic neuritis: Secondary | ICD-10-CM | POA: Diagnosis not present

## 2023-06-29 LAB — GLUCOSE, CAPILLARY: Glucose-Capillary: 130 mg/dL — ABNORMAL HIGH (ref 70–99)

## 2023-06-29 LAB — LYME DISEASE SEROLOGY W/REFLEX: Lyme Total Antibody EIA: NEGATIVE

## 2023-06-29 MED ORDER — ACETAMINOPHEN 500 MG PO TABS
1000.0000 mg | ORAL_TABLET | Freq: Three times a day (TID) | ORAL | Status: DC | PRN
  Administered 2023-06-29 – 2023-06-30 (×3): 1000 mg via ORAL
  Filled 2023-06-29 (×4): qty 2

## 2023-06-29 NOTE — Plan of Care (Signed)

## 2023-06-29 NOTE — Progress Notes (Signed)
PROGRESS NOTE        PATIENT DETAILS Name: Terri Taylor Age: 33 y.o. Sex: female Date of Birth: 05-23-1991 Admit Date: 06/27/2023 Admitting Physician Nolberto Hanlon, MD [id number], Gerilyn Pilgrim, PA-C  Brief Summary: Patient is a 33 y.o.  female with prior history of left optic neuritis-who presented with blurry vision-neuroimaging consistent with recurrence of left optic neuritis-admitted to [facility] and started on IV steroids on 1/28.  Significant events: 1/08>> ED visit-MRI imaging-consistent with left upper acute optic neuritis-declined hospitalization 1/28>> persistent blurry vision-admit to [facility]-IV steroids started 12/28  Significant studies: 1/8>> MRI brain: No acute intracranial abnormality 1/8>> MRI orbits: Acute optic neuritis  Significant microbiology data: None  Procedures: None  Consults: Neurology  Subjective: Unchanged from yesterday-no other complaints.  Objective: Vitals: Blood pressure (!) 140/83, pulse (!) 114, temperature 99 F (37.2 C), temperature source Oral, resp. rate 20, SpO2 98%.   Exam: Awake/alert Nonfocal exam  Pertinent Labs/Radiology:    Latest Ref Rng & Units 06/27/2023    7:56 PM 06/27/2023    5:01 PM 06/07/2023    7:30 PM  CBC  WBC 4.0 - 10.5 K/uL 5.1  6.7  7.4   Hemoglobin 12.0 - 15.0 g/dL 29.5  62.1  30.8   Hematocrit 36.0 - 46.0 % 33.6  36.4  36.0   Platelets 150 - 400 K/uL 313  327  308     Lab Results  Component Value Date   NA 137 06/27/2023   K 3.6 06/27/2023   CL 103 06/27/2023   CO2 22 06/27/2023      Assessment/Plan: Left optic neuritis Unchanged overnight-no improvement in blurry vision compared to yesterday IV steroids x 5 days-started 1/28 Monitor CBGs periodically as patient on high-dose IV steroids. Plan is to discharge-with follow-up with Dr. Epimenio Foot at [facility] as outpatient  BMI: Estimated body mass index is 23.11 kg/m as calculated from the following:   Height as of 06/07/23: 5\' 5"  (1.651 m).    Weight as of 06/07/23: 63 kg.   Code status:   Code Status: Full Code   DVT Prophylaxis: enoxaparin (LOVENOX) injection 40 mg Start: 06/28/23 0800 SCDs Start: 06/27/23 1800   Family Communication: None at bedside   Disposition Plan: Status is: Inpatient Remains inpatient appropriate because: Severity of illness   Planned Discharge Destination:Home   Diet: Diet Order             Diet regular Room service appropriate? Yes; Fluid consistency: Thin  Diet effective now                     Antimicrobial agents: Anti-infectives (From admission, onward)    None        MEDICATIONS: Scheduled Meds:  enoxaparin (LOVENOX) injection  40 mg Subcutaneous Q24H   insulin aspart  0-6 Units Subcutaneous TID WC   pantoprazole  40 mg Oral Daily   sodium chloride flush  3 mL Intravenous Q12H   Continuous Infusions:  methylPREDNISolone (SOLU-MEDROL) injection 1,000 mg (06/28/23 1616)   PRN Meds:.acetaminophen **OR** [DISCONTINUED] acetaminophen, polyethylene glycol   I have personally reviewed following labs and imaging studies  LABORATORY DATA: CBC: Recent Labs  Lab 06/27/23 1701 06/27/23 1956  WBC 6.7 5.1  HGB 12.5 11.8*  HCT 36.4 33.6*  MCV 90.8 89.8  PLT 327 313    Basic Metabolic Panel: Recent Labs  Lab 06/27/23  1701 06/27/23 1956  NA 137  --   K 3.6  --   CL 103  --   CO2 22  --   GLUCOSE 86  --   BUN 17  --   CREATININE 0.43* 0.49  CALCIUM 9.5  --     GFR: CrCl cannot be calculated (Unknown ideal weight.).  Liver Function Tests: Recent Labs  Lab 06/27/23 1956  AST 16  ALT 18  ALKPHOS 43  BILITOT 0.8  PROT 7.4  ALBUMIN 4.3   No results for input(s): "LIPASE", "AMYLASE" in the last 168 hours. No results for input(s): "AMMONIA" in the last 168 hours.  Coagulation Profile: No results for input(s): "INR", "PROTIME" in the last 168 hours.  Cardiac Enzymes: No results for input(s): "CKTOTAL", "CKMB", "CKMBINDEX", "TROPONINI" in the last  168 hours.  BNP (last 3 results) No results for input(s): "PROBNP" in the last 8760 hours.  Lipid Profile: No results for input(s): "CHOL", "HDL", "LDLCALC", "TRIG", "CHOLHDL", "LDLDIRECT" in the last 72 hours.  Thyroid Function Tests: Recent Labs    06/27/23 1956  TSH 0.354    Anemia Panel: Recent Labs    06/27/23 1956  VITAMINB12 643  FOLATE 23.5    Urine analysis:    Component Value Date/Time   COLORURINE YELLOW 12/02/2011 2335   APPEARANCEUR CLEAR 12/02/2011 2335   LABSPEC 1.010 12/02/2011 2335   PHURINE 7.5 12/02/2011 2335   GLUCOSEU NEGATIVE 12/02/2011 2335   HGBUR TRACE (A) 12/02/2011 2335   BILIRUBINUR NEGATIVE 12/02/2011 2335   KETONESUR NEGATIVE 12/02/2011 2335   PROTEINUR NEGATIVE 12/02/2011 2335   UROBILINOGEN 2.0 (H) 12/02/2011 2335   NITRITE NEGATIVE 12/02/2011 2335   LEUKOCYTESUR NEGATIVE 12/02/2011 2335    Sepsis Labs: Lactic Acid, Venous No results found for: "LATICACIDVEN"  MICROBIOLOGY: No results found for this or any previous visit (from the past 240 hours).  RADIOLOGY STUDIES/RESULTS: No results found.   LOS: 2 days   Jeoffrey Massed, MD  [facility]    To contact the attending provider between 7A-7P or the covering provider during after hours 7P-7A, please log into the web site [email or web address] and access using universal  password for that web site. If you do not have the password, please call the hospital operator.  06/29/2023, 11:48 AM

## 2023-06-29 NOTE — Progress Notes (Signed)
NEUROLOGY CONSULT FOLLOW UP NOTE   Date of service: June 29, 2023 Patient Name: Terri Taylor MRN:  [id number] DOB:  [birth year]  Interval Hx/subjective   No significant change in L eye vision after 2 doses of solumedrol. No new complaints today.   Vitals   Vitals:   06/28/23 1952 06/28/23 2330 06/29/23 0836 06/29/23 1150  BP: 118/84 119/82 (!) 140/83 112/83  Pulse: 92 74 (!) 114 76  Resp: 18 18 20 18   Temp: 98.8 F (37.1 C) (!) 97.4 F (36.3 C) 99 F (37.2 C) 98.4 F (36.9 C)  TempSrc: Oral  Oral Oral  SpO2: 98% 98%       There is no height or weight on file to calculate BMI.  Physical Exam   General: Laying comfortably in bed; in no acute distress.  HENT: Normal oropharynx and mucosa. Normal external appearance of ears and nose.  Neck: Supple, no pain or tenderness  CV: No JVD. No peripheral edema.  Pulmonary: Symmetric Chest rise. Normal respiratory effort.  Abdomen: Soft to touch, non-tender.  Ext: No cyanosis, edema, or deformity  Skin: No rash. Normal palpation of skin.   Musculoskeletal: Normal digits and nails by inspection. No clubbing.     Neurologic Examination   Mental status/Cognition: Alert, oriented to self, place, month and year, good attention.  Speech/language: Fluent, comprehension intact, object naming intact, repetition intact.  Cranial nerves:   CN II Pupils equal and reactive to light, no VF deficits    CN III,IV,VI EOM intact, no gaze preference or deviation, no nystagmus    CN V normal sensation in V1, V2, and V3 segments bilaterally    CN VII no asymmetry, no nasolabial fold flattening    CN VIII normal hearing to speech    CN IX & X normal palatal elevation, no uvular deviation    CN XI 5/5 head turn and 5/5 shoulder shrug bilaterally    CN XII midline tongue protrusion     Motor:  Muscle bulk: normal, tone normal, pronator drift none tremor none Mvmt Root Nerve  Muscle Right Left Comments  SA C5/6 Ax Deltoid 5 5    EF C5/6 Mc  Biceps 5 5    EE C6/7/8 Rad Triceps 5 5    WF C6/7 Med FCR        WE C7/8 PIN ECU        F Ab C8/T1 U ADM/FDI 5 5    HF L1/2/3 Fem Illopsoas 5 5    KE L2/3/4 Fem Quad 5 5    DF L4/5 D Peron Tib Ant 5 5    PF S1/2 Tibial Grc/Sol 5 5      Sensation:  Light touch Intact throughout   Pin prick     Temperature     Vibration    Proprioception      Coordination/Complex Motor:  - Finger to Nose intact bilaterally - Heel to shin and bilaterally - Rapid alternating movement are normal - Gait: deferred  Medications  Current Facility-Administered Medications:    acetaminophen (TYLENOL) tablet 1,000 mg, 1,000 mg, Oral, Q8H PRN, 1,000 mg at 06/29/23 0816 **OR** [DISCONTINUED] acetaminophen (TYLENOL) suppository 650 mg, 650 mg, Rectal, Q6H PRN, Nolberto Hanlon, MD   enoxaparin (LOVENOX) injection 40 mg, 40 mg, Subcutaneous, Q24H, Nolberto Hanlon, MD   insulin aspart (novoLOG) injection 0-6 Units, 0-6 Units, Subcutaneous, TID WC, Ghimire, Shanker M, MD   methylPREDNISolone sodium succinate (SOLU-MEDROL) 1,000 mg in sodium chloride 0.9 % 50 mL IVPB, 1,000  mg, Intravenous, Daily, Nolberto Hanlon, MD, Last Rate: 66 mL/hr at 06/28/23 1616, 1,000 mg at 06/28/23 1616   pantoprazole (PROTONIX) EC tablet 40 mg, 40 mg, Oral, Daily, Erick Blinks, MD   polyethylene glycol (MIRALAX / GLYCOLAX) packet 17 g, 17 g, Oral, Daily PRN, Nolberto Hanlon, MD   sodium chloride flush (NS) 0.9 % injection 3 mL, 3 mL, Intravenous, Q12H, Nolberto Hanlon, MD, 3 mL at 06/28/23 2053  Labs and Diagnostic Imaging   CBC:  Recent Labs  Lab 06/27/23 1701 06/27/23 1956  WBC 6.7 5.1  HGB 12.5 11.8*  HCT 36.4 33.6*  MCV 90.8 89.8  PLT 327 313    Basic Metabolic Panel:  Lab Results  Component Value Date   NA 137 06/27/2023   K 3.6 06/27/2023   CO2 22 06/27/2023   GLUCOSE 86 06/27/2023   BUN 17 06/27/2023   CREATININE 0.49 06/27/2023   CALCIUM 9.5 06/27/2023   GFRNONAA >60 06/27/2023   GFRAA >60 02/17/2019   Lipid Panel: No  results found for: "LDLCALC" HgbA1c:  Lab Results  Component Value Date   HGBA1C 5.1 06/28/2023   Urine Drug Screen:     Component Value Date/Time   LABOPIA NONE DETECTED 06/28/2023 0136   COCAINSCRNUR NONE DETECTED 06/28/2023 0136   LABBENZ NONE DETECTED 06/28/2023 0136   AMPHETMU NONE DETECTED 06/28/2023 0136   THCU NONE DETECTED 06/28/2023 0136   LABBARB NONE DETECTED 06/28/2023 0136    Alcohol Level No results found for: "ETH" INR No results found for: "INR" APTT No results found for: "APTT" AED levels: No results found for: "PHENYTOIN", "ZONISAMIDE", "LAMOTRIGINE", "LEVETIRACETA"  MR orbits with and without contrast(personally reviewed): Asymmetric T2 signal intensity and enhancement involving the left optic nerve, suggestive of acute optic neuritis.   MRI Brain(Personally reviewed): Normal brain MRI. No acute intracranial abnormality.   Assessment   Dalena Plantz is a 33 y.o. female with prior history of left optic neuritis with no other noted demyelinating lesions in the brain, presented with recurrent left optic neuritis.  Recommendations   - IV solumedrol daily x 5 doses end date 07/01/23. PPI while on steroids. - NMO panel, MOG Ab, Lyme, RPR is pending. - follow up with Dr. Despina Arias with [facility] outpatient. ______________________________________________________________________   Signed, Jefferson Fuel, MD [facility]

## 2023-06-29 NOTE — Plan of Care (Signed)

## 2023-06-30 DIAGNOSIS — H469 Unspecified optic neuritis: Secondary | ICD-10-CM | POA: Diagnosis not present

## 2023-06-30 LAB — NEUROMYELITIS OPTICA AUTOAB, IGG: NMO-IgG: <1.5 U/mL (ref 0.0–3.0)

## 2023-06-30 LAB — GLUCOSE, CAPILLARY: Glucose-Capillary: 132 mg/dL — ABNORMAL HIGH (ref 70–99)

## 2023-06-30 MED ORDER — SODIUM CHLORIDE 0.9 % IV SOLN
1000.0000 mg | Freq: Every day | INTRAVENOUS | Status: DC
  Administered 2023-06-30 – 2023-07-01 (×2): 1000 mg via INTRAVENOUS
  Filled 2023-06-30 (×2): qty 16

## 2023-06-30 NOTE — Progress Notes (Signed)
PROGRESS NOTE        PATIENT DETAILS Name: Terri Taylor Age: 33 y.o. Sex: female Date of Birth: 1991/01/07 Admit Date: 06/27/2023 Admitting Physician Nolberto Hanlon, MD [id number], Gerilyn Pilgrim, PA-C  Brief Summary: Patient is a 33 y.o.  female with prior history of left optic neuritis-who presented with blurry vision-neuroimaging consistent with recurrence of left optic neuritis-admitted to [facility] and started on IV steroids on 1/28.  Significant events: 1/08>> ED visit-MRI imaging-consistent with left upper acute optic neuritis-declined hospitalization 1/28>> persistent blurry vision-admit to [facility]-IV steroids started 12/28  Significant studies: 1/8>> MRI brain: No acute intracranial abnormality 1/8>> MRI orbits: Acute optic neuritis  Significant microbiology data: None  Procedures: None  Consults: Neurology  Subjective: Thinks some improvement in her vision overnight.  Objective: Vitals: Blood pressure 122/84, pulse 74, temperature 97.9 F (36.6 C), temperature source Oral, resp. rate 16, SpO2 95%.   Exam: Awake/alert Nonfocal exam  Pertinent Labs/Radiology:    Latest Ref Rng & Units 06/27/2023    7:56 PM 06/27/2023    5:01 PM 06/07/2023    7:30 PM  CBC  WBC 4.0 - 10.5 K/uL 5.1  6.7  7.4   Hemoglobin 12.0 - 15.0 g/dL 64.4  03.4  74.2   Hematocrit 36.0 - 46.0 % 33.6  36.4  36.0   Platelets 150 - 400 K/uL 313  327  308     Lab Results  Component Value Date   NA 137 06/27/2023   K 3.6 06/27/2023   CL 103 06/27/2023   CO2 22 06/27/2023      Assessment/Plan: Left optic neuritis Some minimal improvement compared to yesterday IV steroids x 5 days-started 1/28 Monitor CBGs periodically as patient on high-dose IV steroids. Plan is to discharge-with follow-up with Dr. Epimenio Foot at [facility] as outpatient  BMI: Estimated body mass index is 23.11 kg/m as calculated from the following:   Height as of 06/07/23: 5\' 5"  (1.651 m).   Weight as of 06/07/23: 63 kg.    Code status:   Code Status: Full Code   DVT Prophylaxis: enoxaparin (LOVENOX) injection 40 mg Start: 06/28/23 0800 SCDs Start: 06/27/23 1800   Family Communication: None at bedside   Disposition Plan: Status is: Inpatient Remains inpatient appropriate because: Severity of illness   Planned Discharge Destination:Home   Diet: Diet Order             Diet regular Room service appropriate? Yes; Fluid consistency: Thin  Diet effective now                     Antimicrobial agents: Anti-infectives (From admission, onward)    None        MEDICATIONS: Scheduled Meds:  enoxaparin (LOVENOX) injection  40 mg Subcutaneous Q24H   insulin aspart  0-6 Units Subcutaneous TID WC   pantoprazole  40 mg Oral Daily   sodium chloride flush  3 mL Intravenous Q12H   Continuous Infusions:  methylPREDNISolone (SOLU-MEDROL) injection     PRN Meds:.acetaminophen **OR** [DISCONTINUED] acetaminophen, polyethylene glycol   I have personally reviewed following labs and imaging studies  LABORATORY DATA: CBC: Recent Labs  Lab 06/27/23 1701 06/27/23 1956  WBC 6.7 5.1  HGB 12.5 11.8*  HCT 36.4 33.6*  MCV 90.8 89.8  PLT 327 313    Basic Metabolic Panel: Recent Labs  Lab 06/27/23 1701 06/27/23 1956  NA  137  --   K 3.6  --   CL 103  --   CO2 22  --   GLUCOSE 86  --   BUN 17  --   CREATININE 0.43* 0.49  CALCIUM 9.5  --     GFR: CrCl cannot be calculated (Unknown ideal weight.).  Liver Function Tests: Recent Labs  Lab 06/27/23 1956  AST 16  ALT 18  ALKPHOS 43  BILITOT 0.8  PROT 7.4  ALBUMIN 4.3   No results for input(s): "LIPASE", "AMYLASE" in the last 168 hours. No results for input(s): "AMMONIA" in the last 168 hours.  Coagulation Profile: No results for input(s): "INR", "PROTIME" in the last 168 hours.  Cardiac Enzymes: No results for input(s): "CKTOTAL", "CKMB", "CKMBINDEX", "TROPONINI" in the last 168 hours.  BNP (last 3 results) No results for  input(s): "PROBNP" in the last 8760 hours.  Lipid Profile: No results for input(s): "CHOL", "HDL", "LDLCALC", "TRIG", "CHOLHDL", "LDLDIRECT" in the last 72 hours.  Thyroid Function Tests: Recent Labs    06/27/23 1956  TSH 0.354    Anemia Panel: Recent Labs    06/27/23 1956  VITAMINB12 643  FOLATE 23.5    Urine analysis:    Component Value Date/Time   COLORURINE YELLOW 12/02/2011 2335   APPEARANCEUR CLEAR 12/02/2011 2335   LABSPEC 1.010 12/02/2011 2335   PHURINE 7.5 12/02/2011 2335   GLUCOSEU NEGATIVE 12/02/2011 2335   HGBUR TRACE (A) 12/02/2011 2335   BILIRUBINUR NEGATIVE 12/02/2011 2335   KETONESUR NEGATIVE 12/02/2011 2335   PROTEINUR NEGATIVE 12/02/2011 2335   UROBILINOGEN 2.0 (H) 12/02/2011 2335   NITRITE NEGATIVE 12/02/2011 2335   LEUKOCYTESUR NEGATIVE 12/02/2011 2335    Sepsis Labs: Lactic Acid, Venous No results found for: "LATICACIDVEN"  MICROBIOLOGY: No results found for this or any previous visit (from the past 240 hours).  RADIOLOGY STUDIES/RESULTS: No results found.   LOS: 3 days   Jeoffrey Massed, MD  [facility]    To contact the attending provider between 7A-7P or the covering provider during after hours 7P-7A, please log into the web site [email or web address] and access using universal [place] password for that web site. If you do not have the password, please call the hospital operator.  06/30/2023, 11:18 AM

## 2023-06-30 NOTE — Plan of Care (Signed)
  Problem: Education: Goal: Knowledge of General Education information will improve Description: Including pain rating scale, medication(s)/side effects and non-pharmacologic comfort measures Outcome: Progressing   Problem: Health Behavior/Discharge Planning: Goal: Ability to manage health-related needs will improve Outcome: Progressing   Problem: Clinical Measurements: Goal: Ability to maintain clinical measurements within normal limits will improve Outcome: Progressing Goal: Will remain free from infection Outcome: Progressing Goal: Diagnostic test results will improve Outcome: Progressing Goal: Respiratory complications will improve Outcome: Progressing Goal: Cardiovascular complication will be avoided Outcome: Progressing   Problem: Activity: Goal: Risk for activity intolerance will decrease Outcome: Progressing   Problem: Nutrition: Goal: Adequate nutrition will be maintained Outcome: Progressing   Problem: Coping: Goal: Level of anxiety will decrease Outcome: Progressing   Problem: Elimination: Goal: Will not experience complications related to bowel motility Outcome: Progressing Goal: Will not experience complications related to urinary retention Outcome: Progressing   Problem: Safety: Goal: Ability to remain free from injury will improve Outcome: Progressing   Problem: Skin Integrity: Goal: Risk for impaired skin integrity will decrease Outcome: Progressing   Problem: Education: Goal: Ability to describe self-care measures that may prevent or decrease complications (Diabetes Survival Skills Education) will improve Outcome: Progressing Goal: Individualized Educational Video(s) Outcome: Progressing   Problem: Fluid Volume: Goal: Ability to maintain a balanced intake and output will improve Outcome: Progressing   Problem: Health Behavior/Discharge Planning: Goal: Ability to identify and utilize available resources and services will improve Outcome:  Progressing Goal: Ability to manage health-related needs will improve Outcome: Progressing

## 2023-06-30 NOTE — Plan of Care (Signed)

## 2023-06-30 NOTE — Progress Notes (Signed)
NEUROLOGY CONSULT FOLLOW UP NOTE   Date of service: June 30, 2023 Patient Name: Terri Taylor MRN:  [id number] DOB:  10/24/1990  Interval Hx/subjective   L eye vision stable. Bilateral hip pain from bed positioning improved with tylenol.  Vitals   Vitals:   06/29/23 1953 06/30/23 0400 06/30/23 0828 06/30/23 1209  BP: 121/87 107/68 122/84 108/77  Pulse: 86 75 74 80  Resp: 18 16 16 16   Temp: 98.5 F (36.9 C) 97.7 F (36.5 C) 97.9 F (36.6 C) 98.1 F (36.7 C)  TempSrc: Oral Oral Oral Oral  SpO2: 98% 95%       There is no height or weight on file to calculate BMI.  Physical Exam   General: Laying comfortably in bed; in no acute distress.  HENT: Normal oropharynx and mucosa. Normal external appearance of ears and nose.  Neck: Supple, no pain or tenderness  CV: No JVD. No peripheral edema.  Pulmonary: Symmetric Chest rise. Normal respiratory effort.  Abdomen: Soft to touch, non-tender.  Ext: No cyanosis, edema, or deformity  Skin: No rash. Normal palpation of skin.   Musculoskeletal: Normal digits and nails by inspection. No clubbing.     Neurologic Examination   Mental status/Cognition: Alert, oriented to self, place, month and year, good attention.  Speech/language: Fluent, comprehension intact, object naming intact, repetition intact.  Cranial nerves:   CN II Pupils equal and reactive to light, no VF deficits    CN III,IV,VI EOM intact, no gaze preference or deviation, no nystagmus    CN V normal sensation in V1, V2, and V3 segments bilaterally    CN VII no asymmetry, no nasolabial fold flattening    CN VIII normal hearing to speech    CN IX & X normal palatal elevation, no uvular deviation    CN XI 5/5 head turn and 5/5 shoulder shrug bilaterally    CN XII midline tongue protrusion     Motor:  Muscle bulk: normal, tone normal, pronator drift none tremor none Mvmt Root Nerve  Muscle Right Left Comments  SA C5/6 Ax Deltoid 5 5    EF C5/6 Mc Biceps 5 5    EE  C6/7/8 Rad Triceps 5 5    WF C6/7 Med FCR        WE C7/8 PIN ECU        F Ab C8/T1 U ADM/FDI 5 5    HF L1/2/3 Fem Illopsoas 5 5    KE L2/3/4 Fem Quad 5 5    DF L4/5 D Peron Tib Ant 5 5    PF S1/2 Tibial Grc/Sol 5 5      Sensation:  Light touch Intact throughout   Pin prick     Temperature     Vibration    Proprioception      Coordination/Complex Motor:  - Finger to Nose intact bilaterally - Heel to shin and bilaterally - Rapid alternating movement are normal - Gait: deferred  Medications  Current Facility-Administered Medications:    acetaminophen (TYLENOL) tablet 1,000 mg, 1,000 mg, Oral, Q8H PRN, 1,000 mg at 06/30/23 1214 **OR** [DISCONTINUED] acetaminophen (TYLENOL) suppository 650 mg, 650 mg, Rectal, Q6H PRN, Nolberto Hanlon, MD   enoxaparin (LOVENOX) injection 40 mg, 40 mg, Subcutaneous, Q24H, Nolberto Hanlon, MD   insulin aspart (novoLOG) injection 0-6 Units, 0-6 Units, Subcutaneous, TID WC, Ghimire, Shanker M, MD   methylPREDNISolone sodium succinate (SOLU-MEDROL) 1,000 mg in sodium chloride 0.9 % 50 mL IVPB, 1,000 mg, Intravenous, Daily, Ghimire, Werner Lean, MD,  Last Rate: 66 mL/hr at 06/30/23 1213, 1,000 mg at 06/30/23 1213   pantoprazole (PROTONIX) EC tablet 40 mg, 40 mg, Oral, Daily, Erick Blinks, MD   polyethylene glycol (MIRALAX / GLYCOLAX) packet 17 g, 17 g, Oral, Daily PRN, Nolberto Hanlon, MD   sodium chloride flush (NS) 0.9 % injection 3 mL, 3 mL, Intravenous, Q12H, Nolberto Hanlon, MD, 3 mL at 06/30/23 1213  Labs and Diagnostic Imaging   CBC:  Recent Labs  Lab 06/27/23 1701 06/27/23 1956  WBC 6.7 5.1  HGB 12.5 11.8*  HCT 36.4 33.6*  MCV 90.8 89.8  PLT 327 313    Basic Metabolic Panel:  Lab Results  Component Value Date   NA 137 06/27/2023   K 3.6 06/27/2023   CO2 22 06/27/2023   GLUCOSE 86 06/27/2023   BUN 17 06/27/2023   CREATININE 0.49 06/27/2023   CALCIUM 9.5 06/27/2023   GFRNONAA >60 06/27/2023   GFRAA >60 02/17/2019   Lipid Panel: No results  found for: "LDLCALC" HgbA1c:  Lab Results  Component Value Date   HGBA1C 5.1 06/28/2023   Urine Drug Screen:     Component Value Date/Time   LABOPIA NONE DETECTED 06/28/2023 0136   COCAINSCRNUR NONE DETECTED 06/28/2023 0136   LABBENZ NONE DETECTED 06/28/2023 0136   AMPHETMU NONE DETECTED 06/28/2023 0136   THCU NONE DETECTED 06/28/2023 0136   LABBARB NONE DETECTED 06/28/2023 0136    Alcohol Level No results found for: "ETH" INR No results found for: "INR" APTT No results found for: "APTT" AED levels: No results found for: "PHENYTOIN", "ZONISAMIDE", "LAMOTRIGINE", "LEVETIRACETA"  MR orbits with and without contrast(personally reviewed): Asymmetric T2 signal intensity and enhancement involving the left optic nerve, suggestive of acute optic neuritis.   MRI Brain(Personally reviewed): Normal brain MRI. No acute intracranial abnormality.   Assessment   Terri Taylor is a 33 y.o. female with prior history of left optic neuritis with no other noted demyelinating lesions in the brain, presented with recurrent left optic neuritis.  Recommendations   - IV solumedrol daily x 5 doses end date 07/01/23. PPI while on steroids. - NMO panel, MOG Ab, Lyme, RPR is pending - OK to discharge after last solumedrol dose tomorrow at noon to follow up with Dr. Despina Arias with [facility] outpatient. ______________________________________________________________________   Signed, Jefferson Fuel, MD [facility]

## 2023-07-01 DIAGNOSIS — H469 Unspecified optic neuritis: Secondary | ICD-10-CM | POA: Diagnosis not present

## 2023-07-01 NOTE — Discharge Summary (Signed)
PATIENT DETAILS Name: Terri Taylor Age: 33 y.o. Sex: female Date of Birth: 1990-11-05 MRN: [id number]. Admitting Physician: Nolberto Hanlon, MD [id number], Celine Mans  Admit Date: 06/27/2023 Discharge date: 07/01/2023  Recommendations for Outpatient Follow-up:  Follow up with PCP in 1-2 weeks Please obtain CMP/CBC in one week Please ensure outpatient follow up with Neurology  Admitted From:  Home  Disposition: Home   Discharge Condition: good  CODE STATUS:   Code Status: Full Code   Diet recommendation:  Diet Order             Diet general           Diet regular Room service appropriate? Yes; Fluid consistency: Thin  Diet effective now                    Brief Summary: Patient is a 33 y.o.  female with prior history of left optic neuritis-who presented with blurry vision-neuroimaging consistent with recurrence of left optic neuritis-admitted to [facility] and started on IV steroids on 1/28.   Significant events: 1/08>> ED visit-MRI imaging-consistent with left upper acute optic neuritis-declined hospitalization 1/28>> persistent blurry vision-admit to [facility]-IV steroids started 12/28   Significant studies: 1/8>> MRI brain: No acute intracranial abnormality 1/8>> MRI orbits: Acute optic neuritis   Significant microbiology data: None   Procedures: None   Consults: Neurology  Brief Hospital Course: Left optic neuritis Some minimal improvement after IV steroids x 5 days Per my discussion with neurologist-Dr. Ezzard Standing to discharge today-for outpatient follow-up with neurology as an outpatient.   BMI: Estimated body mass index is 23.11 kg/m as calculated from the following:   Height as of 06/07/23: 5\' 5"  (1.651 m).   Weight as of 06/07/23: 63 kg.     Discharge Diagnoses:  Principal Problem:   Optic neuritis   Discharge Instructions:  Activity:  As tolerated   Discharge Instructions     Ambulatory referral to Neurology   Complete by: As directed    An  appointment is requested in approximately: 4 wks   Call MD for:  difficulty breathing, headache or visual disturbances   Complete by: As directed    Call MD for:  extreme fatigue   Complete by: As directed    Call MD for:  persistant dizziness or light-headedness   Complete by: As directed    Diet general   Complete by: As directed    Discharge instructions   Complete by: As directed    Follow with Primary MD  Mendel Ryder, PA-C in 1-2 weeks  Please get a complete blood count and chemistry panel checked by your Primary MD at your next visit, and again as instructed by your Primary MD.  Get Medicines reviewed and adjusted: Please take all your medications with you for your next visit with your Primary MD  Laboratory/radiological data: Please request your Primary MD to go over all hospital tests and procedure/radiological results at the follow up, please ask your Primary MD to get all Hospital records sent to his/her office.  In some cases, they will be blood work, cultures and biopsy results pending at the time of your discharge. Please request that your primary care M.D. follows up on these results.  Also Note the following: If you experience worsening of your admission symptoms, develop shortness of breath, life threatening emergency, suicidal or homicidal thoughts you must seek medical attention immediately by calling 911 or calling your MD immediately  if symptoms less severe.  You must read complete  instructions/literature along with all the possible adverse reactions/side effects for all the Medicines you take and that have been prescribed to you. Take any new Medicines after you have completely understood and accpet all the possible adverse reactions/side effects.   Do not drive when taking Pain medications or sleeping medications (Benzodaizepines)  Do not take more than prescribed Pain, Sleep and Anxiety Medications. It is not advisable to combine anxiety,sleep and pain  medications without talking with your primary care practitioner  Special Instructions: If you have smoked or chewed Tobacco  in the last 2 yrs please stop smoking, stop any regular Alcohol  and or any Recreational drug use.  Wear Seat belts while driving.  Please note: You were cared for by a hospitalist during your hospital stay. Once you are discharged, your primary care physician will handle any further medical issues. Please note that NO REFILLS for any discharge medications will be authorized once you are discharged, as it is imperative that you return to your primary care physician (or establish a relationship with a primary care physician if you do not have one) for your post hospital discharge needs so that they can reassess your need for medications and monitor your lab values.   Increase activity slowly   Complete by: As directed       Allergies as of 07/01/2023       Reactions   Ciprofloxacin    Compazine [prochlorperazine] Other (See Comments)   Pt states she was given this through her IV during an ER visit back in 2015 for a migraine and it caused restlessness and couldn't breath. States she had to come back and was given benadryl.          Medication List     TAKE these medications    acetaminophen 500 MG tablet Commonly known as: TYLENOL Take 500 mg by mouth every 6 (six) hours as needed for mild pain or moderate pain.   ibuprofen 200 MG tablet Commonly known as: ADVIL Take 600 mg by mouth every 6 (six) hours as needed for mild pain or moderate pain.   promethazine 25 MG tablet Commonly known as: PHENERGAN Take 25 mg by mouth 4 (four) times daily as needed.        Follow-up Information     Mendel Ryder, [place]. Schedule an appointment as soon as possible for a visit in 1 week(s).   Specialty: Family Medicine Contact information: [address] [place] [address] [phone]         Asa Lente, MD Follow up.   Specialty:  Neurology Why: Hospital follow up, Office will call with date/time, If you dont hear from them,please give them a call Contact information: [address] [place] [address] [phone]                Allergies  Allergen Reactions   Ciprofloxacin    Compazine [Prochlorperazine] Other (See Comments)    Pt states she was given this through her IV during an ER visit back in 2015 for a migraine and it caused restlessness and couldn't breath. States she had to come back and was given benadryl.       Other Procedures/Studies: MR Brain W and Wo Contrast Result Date: 06/07/2023 CLINICAL DATA:  Prior study from 02/17/2019. EXAM: MRI HEAD AND ORBITS WITHOUT AND WITH CONTRAST TECHNIQUE: Multiplanar, multiecho pulse sequences of the brain and surrounding structures were obtained without and with intravenous contrast. Multiplanar, multiecho pulse sequences of the orbits  and surrounding structures were obtained including fat saturation techniques, before and after intravenous contrast administration. CONTRAST:  6mL GADAVIST GADOBUTROL 1 MMOL/ML IV SOLN COMPARISON:  None Available. FINDINGS: MRI HEAD FINDINGS Brain: Examination degraded by motion artifact. Cerebral volume within normal limits. No focal parenchymal signal abnormality. No evidence for acute or subacute ischemia. Gray-white matter differentiation maintained. No acute or chronic intracranial blood products. No mass lesion, midline shift or mass effect. No hydrocephalus or extra-axial fluid collection. Pituitary gland suprasellar region within normal limits. No abnormal enhancement. Vascular: Major intracranial vascular flow voids are maintained. Skull and upper cervical spine: Cranial junction little limits. Bone marrow signal intensity normal. No scalp soft tissue abnormality. Other: Mastoid air cells are clear. MRI ORBITS FINDINGS Orbits: Examination degraded by motion artifact. Additionally, assessment somewhat limited as the axial  postcontrast T1 sequence is not fat saturated, limiting assessment of the optic nerves. Globes are symmetric in size with normal appearance and morphology. There is asymmetric T2 signal intensity seen involving the left optic nerve (series 21, image 11). Patchy post-contrast enhancement seen within this region (series 26, image 12). Finding suggestive of acute left optic neuritis. No convincing changes of acute optic neuritis about the contralateral right optic nerve. Intraconal and extraconal fat maintained. Lacrimal glands normal. Extra-ocular muscles symmetric and normal. No abnormality about the orbital apices or cavernous sinus. Visualized sinuses: Clear. Soft tissues: Unremarkable. IMPRESSION: MRI HEAD: Normal brain MRI. No acute intracranial abnormality. MRI ORBITS: 1. Motion degraded exam. 2. Asymmetric T2 signal intensity and enhancement involving the left optic nerve, suggestive of acute optic neuritis. Electronically Signed   By: Rise Mu M.D.   On: 06/07/2023 21:19   MR ORBITS W WO CONTRAST Result Date: 06/07/2023 CLINICAL DATA:  Prior study from 02/17/2019. EXAM: MRI HEAD AND ORBITS WITHOUT AND WITH CONTRAST TECHNIQUE: Multiplanar, multiecho pulse sequences of the brain and surrounding structures were obtained without and with intravenous contrast. Multiplanar, multiecho pulse sequences of the orbits and surrounding structures were obtained including fat saturation techniques, before and after intravenous contrast administration. CONTRAST:  6mL GADAVIST GADOBUTROL 1 MMOL/ML IV SOLN COMPARISON:  None Available. FINDINGS: MRI HEAD FINDINGS Brain: Examination degraded by motion artifact. Cerebral volume within normal limits. No focal parenchymal signal abnormality. No evidence for acute or subacute ischemia. Gray-white matter differentiation maintained. No acute or chronic intracranial blood products. No mass lesion, midline shift or mass effect. No hydrocephalus or extra-axial fluid  collection. Pituitary gland suprasellar region within normal limits. No abnormal enhancement. Vascular: Major intracranial vascular flow voids are maintained. Skull and upper cervical spine: Cranial junction little limits. Bone marrow signal intensity normal. No scalp soft tissue abnormality. Other: Mastoid air cells are clear. MRI ORBITS FINDINGS Orbits: Examination degraded by motion artifact. Additionally, assessment somewhat limited as the axial postcontrast T1 sequence is not fat saturated, limiting assessment of the optic nerves. Globes are symmetric in size with normal appearance and morphology. There is asymmetric T2 signal intensity seen involving the left optic nerve (series 21, image 11). Patchy post-contrast enhancement seen within this region (series 26, image 12). Finding suggestive of acute left optic neuritis. No convincing changes of acute optic neuritis about the contralateral right optic nerve. Intraconal and extraconal fat maintained. Lacrimal glands normal. Extra-ocular muscles symmetric and normal. No abnormality about the orbital apices or cavernous sinus. Visualized sinuses: Clear. Soft tissues: Unremarkable. IMPRESSION: MRI HEAD: Normal brain MRI. No acute intracranial abnormality. MRI ORBITS: 1. Motion degraded exam. 2. Asymmetric T2 signal intensity and enhancement involving the left  optic nerve, suggestive of acute optic neuritis. Electronically Signed   By: Rise Mu M.D.   On: 06/07/2023 21:19     TODAY-DAY OF DISCHARGE:  Subjective:   Terri Taylor today has no headache,no chest abdominal pain,no new weakness tingling or numbness, feels much better wants to go home today.   Objective:   Blood pressure 104/60, pulse 61, temperature 98.3 F (36.8 C), temperature source Oral, resp. rate 17, SpO2 100%.  Intake/Output Summary (Last 24 hours) at 07/01/2023 1003 Last data filed at 06/30/2023 2236 Gross per 24 hour  Intake 3 ml  Output --  Net 3 ml   There were no  vitals filed for this visit.  Exam: Awake Alert, Oriented *3, No new F.N deficits, Normal affect [place].AT,PERRAL Supple Neck,No JVD, No cervical lymphadenopathy appriciated.  Symmetrical Chest wall movement, Good air movement bilaterally, CTAB RRR,No Gallops,Rubs or new Murmurs, No Parasternal Heave +ve B.Sounds, Abd Soft, Non tender, No organomegaly appriciated, No rebound -guarding or rigidity. No Cyanosis, Clubbing or edema, No new Rash or bruise   PERTINENT RADIOLOGIC STUDIES: No results found.   PERTINENT LAB RESULTS: CBC: No results for input(s): "WBC", "HGB", "HCT", "PLT" in the last 72 hours. CMET CMP     Component Value Date/Time   NA 137 06/27/2023 1701   K 3.6 06/27/2023 1701   CL 103 06/27/2023 1701   CO2 22 06/27/2023 1701   GLUCOSE 86 06/27/2023 1701   BUN 17 06/27/2023 1701   CREATININE 0.49 06/27/2023 1956   CALCIUM 9.5 06/27/2023 1701   PROT 7.4 06/27/2023 1956   PROT 6.7 02/21/2019 1627   ALBUMIN 4.3 06/27/2023 1956   AST 16 06/27/2023 1956   ALT 18 06/27/2023 1956   ALKPHOS 43 06/27/2023 1956   BILITOT 0.8 06/27/2023 1956   GFRNONAA >60 06/27/2023 1956    GFR CrCl cannot be calculated (Unknown ideal weight.). No results for input(s): "LIPASE", "AMYLASE" in the last 72 hours. No results for input(s): "CKTOTAL", "CKMB", "CKMBINDEX", "TROPONINI" in the last 72 hours. Invalid input(s): "POCBNP" No results for input(s): "DDIMER" in the last 72 hours. Recent Labs    06/28/23 1829  HGBA1C 5.1   No results for input(s): "CHOL", "HDL", "LDLCALC", "TRIG", "CHOLHDL", "LDLDIRECT" in the last 72 hours. No results for input(s): "TSH", "T4TOTAL", "T3FREE", "THYROIDAB" in the last 72 hours.  Invalid input(s): "FREET3" No results for input(s): "VITAMINB12", "FOLATE", "FERRITIN", "TIBC", "IRON", "RETICCTPCT" in the last 72 hours. Coags: No results for input(s): "INR" in the last 72 hours.  Invalid input(s): "PT" Microbiology: No results found for this or  any previous visit (from the past 240 hours).  FURTHER DISCHARGE INSTRUCTIONS:  Get Medicines reviewed and adjusted: Please take all your medications with you for your next visit with your Primary MD  Laboratory/radiological data: Please request your Primary MD to go over all hospital tests and procedure/radiological results at the follow up, please ask your Primary MD to get all Hospital records sent to his/her office.  In some cases, they will be blood work, cultures and biopsy results pending at the time of your discharge. Please request that your primary care M.D. goes through all the records of your hospital data and follows up on these results.  Also Note the following: If you experience worsening of your admission symptoms, develop shortness of breath, life threatening emergency, suicidal or homicidal thoughts you must seek medical attention immediately by calling 911 or calling your MD immediately  if symptoms less severe.  You must read complete  instructions/literature along with all the possible adverse reactions/side effects for all the Medicines you take and that have been prescribed to you. Take any new Medicines after you have completely understood and accpet all the possible adverse reactions/side effects.   Do not drive when taking Pain medications or sleeping medications (Benzodaizepines)  Do not take more than prescribed Pain, Sleep and Anxiety Medications. It is not advisable to combine anxiety,sleep and pain medications without talking with your primary care practitioner  Special Instructions: If you have smoked or chewed Tobacco  in the last 2 yrs please stop smoking, stop any regular Alcohol  and or any Recreational drug use.  Wear Seat belts while driving.  Please note: You were cared for by a hospitalist during your hospital stay. Once you are discharged, your primary care physician will handle any further medical issues. Please note that NO REFILLS for any discharge  medications will be authorized once you are discharged, as it is imperative that you return to your primary care physician (or establish a relationship with a primary care physician if you do not have one) for your post hospital discharge needs so that they can reassess your need for medications and monitor your lab values.  Total Time spent coordinating discharge including counseling, education and face to face time equals less than 30 minutes.  Signed: Ercell Razon 07/01/2023 10:03 AM

## 2023-07-01 NOTE — Plan of Care (Signed)

## 2023-07-03 LAB — MISC LABCORP TEST (SEND OUT)
LabCorp test name: 505310
Labcorp test code: 9985

## 2023-07-04 ENCOUNTER — Telehealth: Payer: Self-pay

## 2023-07-04 NOTE — Telephone Encounter (Signed)
Ok to see Dr. Felecia Shelling

## 2023-07-04 NOTE — Telephone Encounter (Signed)
 We received a referral for this patient from the ED. The referral is for optic neuritis . She is a previous patient of Dr Newman but was advised to f/u with Dr Vear at discharge. She has seen Dr Onita for this diagnosis in 2020  She is wanting to switch her care to Dr Vear. Can you please review and advise if this is acceptable?   Thanks!

## 2023-07-11 NOTE — Progress Notes (Unsigned)
[facility]  PATIENT: Terri Taylor DOB: 10-30-1990  REFERRING DOCTOR OR PCP: Gwyneth Sprout, MD SOURCE: Patient, notes from emergency room, previous notes from [facility] neurology, imaging and lab reports, MRI images personally reviewed.  _________________________________   HISTORICAL  CHIEF COMPLAINT:  No chief complaint on file.   HISTORY OF PRESENT ILLNESS:  I had the pleasure of seeing your patient, Terri Taylor, at [facility] for neurologic consultation regarding her recent optic neuritis.  She is a 33 year old woman who had an episode of left optic neuritis in September 2020.  At that time, MRI of the brain was normal.  She presented to the emergency room on 06/07/2023 with recurrent visual symptoms.  Imaging studies showed enhancement left optic nerve.  IV steroids was discussed with her but she opted not to be admitted for this at that time as she had trouble tolerating steroids in the past..  She represented to the emergency room on 06/27/2023 with additional visual symptoms and was admitted and received 5 days of IV Solu-Medrol.  After the first episode of optic neuritis in 2020, she saw Dr. Terrace Arabia after she was admitted to the hospital for IV steroids.  ***ACE,    CSF?  MRI of the brain and orbits 06/07/2023 showed normal brain before and after contrast.  There is enhancement and T2 hyperintensity of the left optic nerve.  It also shows restricted diffusion on DWI.  Laboratory tests Anti-MOG, anti-NMO,B12, TSH, Lyme serology, RPR, HIV, ESR/CRP, CBC, CMP were negative or noncontributory.  REVIEW OF SYSTEMS: Constitutional: No fevers, chills, sweats, or change in appetite Eyes: No visual changes, double vision, eye pain Ear, nose and throat: No hearing loss, ear pain, nasal congestion, sore throat Cardiovascular: No chest pain, palpitations Respiratory:  No shortness of breath at rest or with exertion.   No  wheezes GastrointestinaI: No nausea, vomiting, diarrhea, abdominal pain, fecal incontinence Genitourinary:  No dysuria, urinary retention or frequency.  No nocturia. Musculoskeletal:  No neck pain, back pain Integumentary: No rash, pruritus, skin lesions Neurological: as above Psychiatric: No depression at this time.  No anxiety Endocrine: No palpitations, diaphoresis, change in appetite, change in weigh or increased thirst Hematologic/Lymphatic:  No anemia, purpura, petechiae. Allergic/Immunologic: No itchy/runny eyes, nasal congestion, recent allergic reactions, rashes  ALLERGIES: Allergies  Allergen Reactions   Ciprofloxacin    Compazine [Prochlorperazine] Other (See Comments)    Pt states she was given this through her IV during an ER visit back in 2015 for a migraine and it caused restlessness and couldn't breath. States she had to come back and was given benadryl.      HOME MEDICATIONS:  Current Outpatient Medications:    acetaminophen (TYLENOL) 500 MG tablet, Take 500 mg by mouth every 6 (six) hours as needed for mild pain or moderate pain., Disp: , Rfl:    ibuprofen (ADVIL) 200 MG tablet, Take 600 mg by mouth every 6 (six) hours as needed for mild pain or moderate pain., Disp: , Rfl:    promethazine (PHENERGAN) 25 MG tablet, Take 25 mg by mouth 4 (four) times daily as needed., Disp: , Rfl:   PAST MEDICAL HISTORY: Past Medical History:  Diagnosis Date   Bladder pain    Eczema    Frequency of urination    Interstitial cystitis    Nocturia    Optic neuritis, left    Urgency of urination    Wears contact lenses     PAST SURGICAL HISTORY: Past Surgical History:  Procedure Laterality  Date   CYSTO WITH HYDRODISTENSION N/A 10/21/2014   Procedure: CYSTOSCOPY/HYDRODISTENSION MARCAINE AND PYRIDIUM ;  Surgeon: Bjorn Pippin, MD;  Location: [facility];  Service: Urology;  Laterality: N/A;    FAMILY HISTORY: Family History  Problem Relation Age of Onset    Healthy Mother    Healthy Father    Diabetes Maternal Grandfather     SOCIAL HISTORY: Social History   Socioeconomic History   Marital status: Single    Spouse name: Not on file   Number of children: 0   Years of education: college   Highest education level: Bachelor's degree (e.g., BA, AB, BS)  Occupational History   Occupation: CNA  Tobacco Use   Smoking status: Never   Smokeless tobacco: Never  Substance and Sexual Activity   Alcohol use: No   Drug use: No   Sexual activity: Yes  Other Topics Concern   Not on file  Social History Narrative   Lives at home with mother.   Right-handed.   One cup caffeine per day.   Social Drivers of Corporate investment banker Strain: Not on file  Food Insecurity: No Food Insecurity (06/28/2023)   Hunger Vital Sign    Worried About Running Out of Food in the Last Year: Never true    Ran Out of Food in the Last Year: Never true  Transportation Needs: No Transportation Needs (06/28/2023)   PRAPARE - Administrator, Civil Service (Medical): No    Lack of Transportation (Non-Medical): No  Physical Activity: Not on file  Stress: Not on file  Social Connections: Not on file  Intimate Partner Violence: Not At Risk (06/28/2023)   Humiliation, Afraid, Rape, and Kick questionnaire    Fear of Current or Ex-Partner: No    Emotionally Abused: No    Physically Abused: No    Sexually Abused: No       PHYSICAL EXAM  There were no vitals filed for this visit.  There is no height or weight on file to calculate BMI.   General: The patient is well-developed and well-nourished and in no acute distress  HEENT:  Head is [place]/AT.  Sclera are anicteric.  Funduscopic exam shows normal optic discs and retinal vessels.  Neck: No carotid bruits are noted.  The neck is nontender.  Cardiovascular: The heart has a regular rate and rhythm with a normal S1 and S2. There were no murmurs, gallops or rubs.    Skin: Extremities are without rash  or  edema.  Musculoskeletal:  Back is nontender  Neurologic Exam  Mental status: The patient is alert and oriented x 3 at the time of the examination. The patient has apparent normal recent and remote memory, with an apparently normal attention span and concentration ability.   Speech is normal.  Cranial nerves: Extraocular movements are full. Pupils are equal, round, and reactive to light and accomodation.  Visual fields are full.  Facial symmetry is present. There is good facial sensation to soft touch bilaterally.Facial strength is normal.  Trapezius and sternocleidomastoid strength is normal. No dysarthria is noted.  The tongue is midline, and the patient has symmetric elevation of the soft palate. No obvious hearing deficits are noted.  Motor:  Muscle bulk is normal.   Tone is normal. Strength is  5 / 5 in all 4 extremities.   Sensory: Sensory testing is intact to pinprick, soft touch and vibration sensation in all 4 extremities.  Coordination: Cerebellar testing reveals good finger-nose-finger and  heel-to-shin bilaterally.  Gait and station: Station is normal.   Gait is normal. Tandem gait is normal. Romberg is negative.   Reflexes: Deep tendon reflexes are symmetric and normal bilaterally.   Plantar responses are flexor.    DIAGNOSTIC DATA (LABS, IMAGING, TESTING) - I reviewed patient records, labs, notes, testing and imaging myself where available.  Lab Results  Component Value Date   WBC 5.1 06/27/2023   HGB 11.8 (L) 06/27/2023   HCT 33.6 (L) 06/27/2023   MCV 89.8 06/27/2023   PLT 313 06/27/2023      Component Value Date/Time   NA 137 06/27/2023 1701   K 3.6 06/27/2023 1701   CL 103 06/27/2023 1701   CO2 22 06/27/2023 1701   GLUCOSE 86 06/27/2023 1701   BUN 17 06/27/2023 1701   CREATININE 0.49 06/27/2023 1956   CALCIUM 9.5 06/27/2023 1701   PROT 7.4 06/27/2023 1956   PROT 6.7 02/21/2019 1627   ALBUMIN 4.3 06/27/2023 1956   AST 16 06/27/2023 1956   ALT 18  06/27/2023 1956   ALKPHOS 43 06/27/2023 1956   BILITOT 0.8 06/27/2023 1956   GFRNONAA >60 06/27/2023 1956   GFRAA >60 02/17/2019 1157   No results found for: "CHOL", "HDL", "LDLCALC", "LDLDIRECT", "TRIG", "CHOLHDL" Lab Results  Component Value Date   HGBA1C 5.1 06/28/2023   Lab Results  Component Value Date   VITAMINB12 643 06/27/2023   Lab Results  Component Value Date   TSH 0.354 06/27/2023       ASSESSMENT AND PLAN  ***   Deionte Spivack A. Epimenio Foot, MD, [facility] 07/11/2023, 8:18 PM Certified in Neurology, Clinical Neurophysiology, Sleep Medicine and Neuroimaging  [facility] [address] [place], [place] [address] [phone]

## 2023-07-13 ENCOUNTER — Ambulatory Visit (INDEPENDENT_AMBULATORY_CARE_PROVIDER_SITE_OTHER): Payer: Medicaid Other | Admitting: Neurology

## 2023-07-13 ENCOUNTER — Encounter: Payer: Self-pay | Admitting: Neurology

## 2023-07-13 VITALS — BP 115/74 | HR 92 | Ht 65.0 in | Wt 138.0 lb

## 2023-07-13 DIAGNOSIS — H469 Unspecified optic neuritis: Secondary | ICD-10-CM | POA: Diagnosis not present

## 2023-07-13 DIAGNOSIS — R292 Abnormal reflex: Secondary | ICD-10-CM | POA: Diagnosis not present

## 2023-07-13 DIAGNOSIS — G379 Demyelinating disease of central nervous system, unspecified: Secondary | ICD-10-CM

## 2023-07-13 DIAGNOSIS — E559 Vitamin D deficiency, unspecified: Secondary | ICD-10-CM

## 2023-07-14 LAB — ANTI-MOG, SERUM: MOG Antibody, Cell-based IFA: NEGATIVE

## 2023-07-14 LAB — ANGIOTENSIN CONVERTING ENZYME: Angio Convert Enzyme: 32 U/L (ref 14–82)

## 2023-07-20 ENCOUNTER — Encounter: Payer: Self-pay | Admitting: Neurology

## 2023-07-20 LAB — ANA+ENA+DNA/DS+SCL 70+SJOSSA/B
ENA RNP Ab: 0.2 AI (ref 0.0–0.9)
ENA SM Ab Ser-aCnc: <0.2 AI (ref 0.0–0.9)
ENA SSA (RO) Ab: <0.2 AI (ref 0.0–0.9)
ENA SSB (LA) Ab: <0.2 AI (ref 0.0–0.9)
Scleroderma (Scl-70) (ENA) Antibody, IgG: <0.2 AI (ref 0.0–0.9)
dsDNA Ab: <1 [IU]/mL (ref 0–9)

## 2023-07-20 LAB — ANCA PROFILE
Anti-MPO Antibodies: <0.2 U (ref 0.0–0.9)
Anti-PR3 Antibodies: <0.2 U (ref 0.0–0.9)
Atypical pANCA: <1:20 {titer}
C-ANCA: <1:20 {titer}
P-ANCA: <1:20 {titer}

## 2023-07-20 LAB — VITAMIN D 25 HYDROXY (VIT D DEFICIENCY, FRACTURES): Vit D, 25-Hydroxy: 30.3 ng/mL (ref 30.0–100.0)

## 2023-07-24 ENCOUNTER — Telehealth: Payer: Self-pay | Admitting: Neurology

## 2023-07-24 NOTE — Telephone Encounter (Signed)
 healthy blue Berkley Harvey: [id number] exp. 07/24/23-09/21/23 sent to [facility] [phone]

## 2023-07-28 ENCOUNTER — Ambulatory Visit
Admission: RE | Admit: 2023-07-28 | Discharge: 2023-07-28 | Disposition: A | Discharge status: Home / Self Care | Payer: Medicaid Other | Source: Ambulatory Visit | Attending: Neurology | Admitting: Neurology

## 2023-07-28 DIAGNOSIS — R292 Abnormal reflex: Secondary | ICD-10-CM

## 2023-07-28 DIAGNOSIS — H469 Unspecified optic neuritis: Secondary | ICD-10-CM

## 2023-07-28 DIAGNOSIS — G379 Demyelinating disease of central nervous system, unspecified: Secondary | ICD-10-CM

## 2023-09-28 ENCOUNTER — Telehealth: Payer: Self-pay | Admitting: Neurology

## 2023-09-28 NOTE — Telephone Encounter (Signed)
 Pt saw Dr. Godwin Lat 07/13/23. Next f/u: 04/16/24.   I called pt. Sx are about the same since last visit. Had MRI Brain 05/2023 and had steroids before MRI. Told by Dr. Godwin Lat no inflammation but feels this is due to steroids she took. Experiencing vision changes. When tired, vision feels strained.  Wanting to know if there is anything more that can be done for further evaluation/treatment. Very concerned she is still having ongoing symptoms. Aware I will send to MD to review and will call back with his recommendation.  Per last note:

## 2023-09-28 NOTE — Telephone Encounter (Signed)
 Pt said still having problem with optic neuritis left eye. Pt did not wish to elaborate.Would like a call back.

## 2023-09-29 NOTE — Telephone Encounter (Signed)
 Called and LVM for pt relaying Dr. Thom Fleeting message. Asked her to call back if she has any further questions.

## 2023-11-02 ENCOUNTER — Ambulatory Visit: Admitting: Family Medicine

## 2024-04-16 ENCOUNTER — Ambulatory Visit (INDEPENDENT_AMBULATORY_CARE_PROVIDER_SITE_OTHER): Payer: No Typology Code available for payment source | Admitting: Neurology

## 2024-04-16 ENCOUNTER — Encounter: Payer: Self-pay | Admitting: Neurology

## 2024-04-16 VITALS — BP 100/64 | HR 80 | Ht 65.0 in | Wt 140.5 lb

## 2024-04-16 DIAGNOSIS — R292 Abnormal reflex: Secondary | ICD-10-CM | POA: Diagnosis not present

## 2024-04-16 DIAGNOSIS — G379 Demyelinating disease of central nervous system, unspecified: Secondary | ICD-10-CM

## 2024-04-16 DIAGNOSIS — H469 Unspecified optic neuritis: Secondary | ICD-10-CM

## 2024-04-16 NOTE — Progress Notes (Signed)
 [facility]  PATIENT: Terri Taylor DOB: 1991/03/05  REFERRING DOCTOR OR PCP: Benton Shone, MD SOURCE: Patient, notes from emergency room, previous notes from [facility] neurology, imaging and lab reports, MRI images personally reviewed.  _________________________________   HISTORICAL  CHIEF COMPLAINT:  Chief Complaint  Patient presents with   Follow-up    Pt in room 10.alone. Here for optic neuritis follow up. Pt asked if MRI can be scheduled. Last eye appointment was Oct.     HISTORY OF PRESENT ILLNESS:  Terri Taylor is a 33 y.o. woman withoptic neuritis.  Update 04/16/2024: She denies any new visual or other neurologic symptom.   Her VA has returned to baseline (was 20/20-1 today).  Her colr vision was symmetric.   She does wear correction.   Gait, strength and sensation are fine.     Bladder function is fine.  Cognition is fine.  Occasionally has some neck or back pain.  Occasionally has tingling in leg but never > 24 hours.     She denies urinary urgency though had some urgency when she had UTI 10 years ago.   No incontinece  She is otherwise healthy.  No family history of autoimmune disorders.  After last visit we checked  Episodes of optic neuritis: #1: She  had an episode of left optic neuritis in September 2020. She had eye pain (worse when she wore her contact).  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  Initially she felt it was due to wearing a contact lens but when she took her lens ut, she was still blind OS.   She saw Dr. Maree who sent her to the ED.   She received IV Solumedrol and was referred to Dr. Onita.   At that time, MRI of the brain was normal.  The MRI of the orbits that showed enhancement of the left optic nerve.  Vision improved and she was back to 20/20 after the steroid and a month or two of time.       #2  She presented to the emergency room on 06/07/2023 with distorted vision since 03/2023.  She reports mild eye pain with  movements but not as bad as last time..  Imaging studies showed enhancement left optic nerve.  IV steroids was discussed with her but she opted not to be admitted for this at that time as she had trouble tolerating steroids in the past..  She represented to the emergency room on 06/27/2023 with additional visual symptoms and was admitted and received 5 days of IV Solu-Medrol .  She upped her Vit D supplements after the labwork showed borderline normal Vit D=30.2.      Imaging: MRI of the brain and orbits 06/07/2023 showed normal brain before and after contrast.  There is enhancement and T2 hyperintensity of the left optic nerve.  It also shows restricted diffusion on DWI.  She has mild right  MRI Brain, cervical spine, thoracic spine 02/18/2024 showed no evidence of demyelination  MRI orbits 02/18/2019 show subtle enhancement of the intraorbital and canalicular segments of the left optic nerve.  MRI of the brain 02/17/2019 was normal.  Laboratory tests Anti-MOG, anti-NMO,B12, TSH, Lyme serology, RPR, HIV, ESR/CRP, CBC, CMP were negative or noncontributory. Repeat Anti-MOG, ANCA, ANA, ACE was nprmal 07/13/2023   Vit D low normal at 30.2  REVIEW OF SYSTEMS: Constitutional: No fevers, chills, sweats, or change in appetite Eyes: No visual changes, double vision, eye pain Ear, nose and throat: No hearing loss, ear pain, nasal  congestion, sore throat Cardiovascular: No chest pain, palpitations Respiratory:  No shortness of breath at rest or with exertion.   No wheezes GastrointestinaI: No nausea, vomiting, diarrhea, abdominal pain, fecal incontinence Genitourinary:  No dysuria, urinary retention or frequency.  No nocturia. Musculoskeletal:  No neck pain, back pain Integumentary: No rash, pruritus, skin lesions Neurological: as above Psychiatric: No depression at this time.  No anxiety Endocrine: No palpitations, diaphoresis, change in appetite, change in weigh or increased  thirst Hematologic/Lymphatic:  No anemia, purpura, petechiae. Allergic/Immunologic: No itchy/runny eyes, nasal congestion, recent allergic reactions, rashes  ALLERGIES: Allergies  Allergen Reactions   Ciprofloxacin     Compazine  [Prochlorperazine ] Other (See Comments)    Pt states she was given this through her IV during an ER visit back in 2015 for a migraine and it caused restlessness and couldn't breath. States she had to come back and was given benadryl .      HOME MEDICATIONS:  Current Outpatient Medications:    acetaminophen  (TYLENOL ) 500 MG tablet, Take 500 mg by mouth every 6 (six) hours as needed for mild pain or moderate pain., Disp: , Rfl:    ibuprofen (ADVIL) 200 MG tablet, Take 600 mg by mouth every 6 (six) hours as needed for mild pain or moderate pain., Disp: , Rfl:   PAST MEDICAL HISTORY: Past Medical History:  Diagnosis Date   Bladder pain    Eczema    Frequency of urination    Interstitial cystitis    Nocturia    Optic neuritis, left    Urgency of urination    Wears contact lenses     PAST SURGICAL HISTORY: Past Surgical History:  Procedure Laterality Date   CYSTO WITH HYDRODISTENSION N/A 10/21/2014   Procedure: CYSTOSCOPY/HYDRODISTENSION MARCAINE  AND PYRIDIUM  ;  Surgeon: Norleen Seltzer, MD;  Location: [facility];  Service: Urology;  Laterality: N/A;    FAMILY HISTORY: Family History  Problem Relation Age of Onset   Healthy Mother    Healthy Father    Diabetes Maternal Grandfather     SOCIAL HISTORY: Social History   Socioeconomic History   Marital status: Single    Spouse name: Not on file   Number of children: 0   Years of education: college   Highest education level: Bachelor's degree (e.g., BA, AB, BS)  Occupational History   Occupation: CNA  Tobacco Use   Smoking status: Never   Smokeless tobacco: Never  Vaping Use   Vaping status: Never Used  Substance and Sexual Activity   Alcohol use: No   Drug use: No   Sexual  activity: Yes  Other Topics Concern   Not on file  Social History Narrative   Lives at home with mother.   Right-handed.   One cup caffeine per day.   Social Drivers of Corporate Investment Banker Strain: Not on file  Food Insecurity: No Food Insecurity (06/28/2023)   Hunger Vital Sign    Worried About Running Out of Food in the Last Year: Never true    Ran Out of Food in the Last Year: Never true  Transportation Needs: No Transportation Needs (06/28/2023)   PRAPARE - Administrator, Civil Service (Medical): No    Lack of Transportation (Non-Medical): No  Physical Activity: Not on file  Stress: Not on file  Social Connections: Not on file  Intimate Partner Violence: Not At Risk (06/28/2023)   Humiliation, Afraid, Rape, and Kick questionnaire    Fear of Current or Ex-Partner: No  Emotionally Abused: No    Physically Abused: No    Sexually Abused: No       PHYSICAL EXAM  Vitals:   04/16/24 0948  BP: 100/64  Pulse: 80  SpO2: 98%  Weight: 140 lb 8 oz (63.7 kg)  Height: 5' 5 (1.651 m)    Body mass index is 23.38 kg/m.  OS:  20/20-1 OD 20/20 Color vision symmetric  General: The patient is well-developed and well-nourished and in no acute distress  HEENT:  Head is [place]/AT.  Sclera are anicteric.  Funduscopic exam shows normal optic discs and retinal vessels.  Neck: No carotid bruits are noted.  The neck is nontender.  Cardiovascular: The heart has a regular rate and rhythm with a normal S1 and S2. There were no murmurs, gallops or rubs.    Skin: Extremities are without rash or  edema.  Musculoskeletal:  Back is nontender  Neurologic Exam  Mental status: The patient is alert and oriented x 3 at the time of the examination. The patient has apparent normal recent and remote memory, with an apparently normal attention span and concentration ability.   Speech is normal.  Cranial nerves: Extraocular movements are full. Pupils are equal, round, and  reactive to light and accomodation.  Visual fields are full.  Facial symmetry is present. There is good facial sensation to soft touch bilaterally.Facial strength is normal.  Trapezius and sternocleidomastoid strength is normal. No dysarthria is noted.  The tongue is midline, and the patient has symmetric elevation of the soft palate. No obvious hearing deficits are noted.  Motor:  Muscle bulk is normal.   Tone is normal. Strength is  5 / 5 in all 4 extremities.   Sensory: Sensory testing is intact to pinprick, soft touch and vibration sensation in all 4 extremities.  Coordination: Cerebellar testing reveals good finger-nose-finger and heel-to-shin bilaterally.  Gait and station: Station is normal.   The gait and the tandem gait are normal.  Romberg is negative.   Reflexes: Deep tendon reflexes are symmetric and increased in arms, increased with crossed adductors at knees and increased with nonsustained clonus at ankles.  .   Plantar responses are flexor.    DIAGNOSTIC DATA (LABS, IMAGING, TESTING) - I reviewed patient records, labs, notes, testing and imaging myself where available.  Lab Results  Component Value Date   WBC 5.1 06/27/2023   HGB 11.8 (L) 06/27/2023   HCT 33.6 (L) 06/27/2023   MCV 89.8 06/27/2023   PLT 313 06/27/2023      Component Value Date/Time   NA 137 06/27/2023 1701   K 3.6 06/27/2023 1701   CL 103 06/27/2023 1701   CO2 22 06/27/2023 1701   GLUCOSE 86 06/27/2023 1701   BUN 17 06/27/2023 1701   CREATININE 0.49 06/27/2023 1956   CALCIUM 9.5 06/27/2023 1701   PROT 7.4 06/27/2023 1956   PROT 6.7 02/21/2019 1627   ALBUMIN 4.3 06/27/2023 1956   AST 16 06/27/2023 1956   ALT 18 06/27/2023 1956   ALKPHOS 43 06/27/2023 1956   BILITOT 0.8 06/27/2023 1956   GFRNONAA >60 06/27/2023 1956   GFRAA >60 02/17/2019 1157   No results found for: CHOL, HDL, LDLCALC, LDLDIRECT, TRIG, CHOLHDL Lab Results  Component Value Date   HGBA1C 5.1 06/28/2023   Lab  Results  Component Value Date   VITAMINB12 643 06/27/2023   Lab Results  Component Value Date   TSH 0.354 06/27/2023       ASSESSMENT AND PLAN  Recurrent  optic neuritis - Plan: MR BRAIN W WO CONTRAST, MR CERVICAL SPINE WO CONTRAST, MR THORACIC SPINE WO CONTRAST  Demyelinating disease (HCC) - Plan: MR BRAIN W WO CONTRAST, MR CERVICAL SPINE WO CONTRAST, MR THORACIC SPINE WO CONTRAST  Hyperreflexia - Plan: MR BRAIN W WO CONTRAST, MR CERVICAL SPINE WO CONTRAST, MR THORACIC SPINE WO CONTRAST   She has recurrent optic neuritis with the last episode January 2025.  MRI of the brain at that time was normal.  Because she has had 2 demyelinating events I am concerned about the possibility of MS.  Therefore, we need to recheck an MRI of the brain to determine if there has been additional changes consistent with MS.   Additionally, she has hyperreflexia with nonsustained clonus at the ankles and spread at the knees.  We need to check an MRI of the brain and cervical spine to determine if she has demyelinating plaques there.  If she does, the likelihood of MS becomes higher and we would need to consider a disease modifying therapy.  This will also allow us  to rule out other causes of myelopathy that could lead to hyperreflexia. Stay active and exercise as tolerated.  We discussed possible symptoms of multiple sclerosis and I advised her to contact us  if she has any of the symptoms.  Otherwise, I will see her back in 1 year for regular follow-up.  Rosemary Mossbarger A. Vear, MD, [facility] 04/16/2024, 10:36 AM Certified in Neurology, Clinical Neurophysiology, Sleep Medicine and Neuroimaging  [facility] [address] [place], [place] [address] [phone]

## 2024-04-24 ENCOUNTER — Telehealth: Payer: Self-pay | Admitting: Neurology

## 2024-04-24 NOTE — Telephone Encounter (Signed)
 I spoke to [facility] and her policy is inactive so she is self-pay, sent to GI [phone]

## 2024-05-29 ENCOUNTER — Other Ambulatory Visit

## 2024-06-23 ENCOUNTER — Other Ambulatory Visit

## 2024-07-08 ENCOUNTER — Other Ambulatory Visit
